# Patient Record
Sex: Male | Born: 1945 | Race: White | Hispanic: No | Marital: Married | Smoking: Former smoker
Health system: Southern US, Community
[De-identification: ages and names within clinical notes are randomized; demographics above are authoritative.]

## PROBLEM LIST (undated history)

## (undated) DIAGNOSIS — M3500 Sicca syndrome, unspecified: Secondary | ICD-10-CM

## (undated) DIAGNOSIS — R0600 Dyspnea, unspecified: Secondary | ICD-10-CM

## (undated) DIAGNOSIS — I10 Essential (primary) hypertension: Secondary | ICD-10-CM

## (undated) DIAGNOSIS — M329 Systemic lupus erythematosus, unspecified: Secondary | ICD-10-CM

## (undated) DIAGNOSIS — E7800 Pure hypercholesterolemia, unspecified: Secondary | ICD-10-CM

## (undated) DIAGNOSIS — B1920 Unspecified viral hepatitis C without hepatic coma: Secondary | ICD-10-CM

## (undated) DIAGNOSIS — K759 Inflammatory liver disease, unspecified: Secondary | ICD-10-CM

## (undated) DIAGNOSIS — M1990 Unspecified osteoarthritis, unspecified site: Secondary | ICD-10-CM

## (undated) DIAGNOSIS — F0280 Dementia in other diseases classified elsewhere without behavioral disturbance: Secondary | ICD-10-CM

## (undated) DIAGNOSIS — D61818 Other pancytopenia: Secondary | ICD-10-CM

## (undated) DIAGNOSIS — A6000 Herpesviral infection of urogenital system, unspecified: Secondary | ICD-10-CM

## (undated) DIAGNOSIS — IMO0002 Reserved for concepts with insufficient information to code with codable children: Secondary | ICD-10-CM

## (undated) HISTORY — PX: HERNIA REPAIR: SHX51

## (undated) HISTORY — DX: Dementia in other diseases classified elsewhere, unspecified severity, without behavioral disturbance, psychotic disturbance, mood disturbance, and anxiety: F02.80

## (undated) HISTORY — PX: COLONOSCOPY: SHX174

## (undated) HISTORY — PX: KNEE ARTHROSCOPY: SUR90

---

## 2004-01-16 ENCOUNTER — Other Ambulatory Visit: Payer: Self-pay

## 2004-04-06 ENCOUNTER — Ambulatory Visit: Payer: Self-pay | Admitting: Internal Medicine

## 2004-05-07 ENCOUNTER — Ambulatory Visit: Payer: Self-pay | Admitting: Internal Medicine

## 2004-07-10 ENCOUNTER — Observation Stay: Payer: Self-pay | Admitting: Specialist

## 2004-07-13 ENCOUNTER — Emergency Department: Payer: Self-pay | Admitting: Emergency Medicine

## 2004-07-26 ENCOUNTER — Inpatient Hospital Stay: Payer: Self-pay | Admitting: Specialist

## 2004-07-30 ENCOUNTER — Observation Stay: Payer: Self-pay | Admitting: Internal Medicine

## 2004-07-31 ENCOUNTER — Observation Stay: Payer: Self-pay | Admitting: Internal Medicine

## 2004-08-01 ENCOUNTER — Ambulatory Visit: Payer: Self-pay | Admitting: Internal Medicine

## 2004-08-03 ENCOUNTER — Observation Stay: Payer: Self-pay | Admitting: Internal Medicine

## 2004-08-07 ENCOUNTER — Ambulatory Visit: Payer: Self-pay | Admitting: Internal Medicine

## 2004-09-04 ENCOUNTER — Ambulatory Visit: Payer: Self-pay | Admitting: Internal Medicine

## 2004-10-05 ENCOUNTER — Ambulatory Visit: Payer: Self-pay | Admitting: Internal Medicine

## 2004-11-28 ENCOUNTER — Ambulatory Visit: Payer: Self-pay | Admitting: Internal Medicine

## 2004-12-05 ENCOUNTER — Ambulatory Visit: Payer: Self-pay | Admitting: Internal Medicine

## 2004-12-06 ENCOUNTER — Encounter: Payer: Self-pay | Admitting: Rheumatology

## 2005-01-03 ENCOUNTER — Ambulatory Visit: Payer: Self-pay | Admitting: Rheumatology

## 2005-01-04 ENCOUNTER — Encounter: Payer: Self-pay | Admitting: Rheumatology

## 2005-01-04 ENCOUNTER — Ambulatory Visit: Payer: Self-pay | Admitting: Internal Medicine

## 2008-08-15 ENCOUNTER — Ambulatory Visit: Payer: Self-pay | Admitting: Gastroenterology

## 2010-08-09 ENCOUNTER — Ambulatory Visit: Payer: Self-pay | Admitting: Family Medicine

## 2013-09-05 ENCOUNTER — Ambulatory Visit: Payer: Self-pay | Admitting: Gastroenterology

## 2013-09-07 LAB — PATHOLOGY REPORT

## 2013-09-09 ENCOUNTER — Ambulatory Visit: Payer: Self-pay | Admitting: Rheumatology

## 2013-09-12 ENCOUNTER — Ambulatory Visit: Payer: Self-pay | Admitting: Rheumatology

## 2013-09-23 DIAGNOSIS — Z86010 Personal history of colonic polyps: Secondary | ICD-10-CM | POA: Insufficient documentation

## 2013-09-23 DIAGNOSIS — B1920 Unspecified viral hepatitis C without hepatic coma: Secondary | ICD-10-CM | POA: Insufficient documentation

## 2013-09-23 DIAGNOSIS — K648 Other hemorrhoids: Secondary | ICD-10-CM | POA: Insufficient documentation

## 2013-09-23 DIAGNOSIS — D126 Benign neoplasm of colon, unspecified: Secondary | ICD-10-CM | POA: Insufficient documentation

## 2014-01-05 ENCOUNTER — Ambulatory Visit: Payer: Self-pay

## 2014-07-10 DIAGNOSIS — N5201 Erectile dysfunction due to arterial insufficiency: Secondary | ICD-10-CM | POA: Insufficient documentation

## 2014-07-10 DIAGNOSIS — I10 Essential (primary) hypertension: Secondary | ICD-10-CM | POA: Insufficient documentation

## 2014-11-07 ENCOUNTER — Other Ambulatory Visit: Payer: Self-pay | Admitting: Gastroenterology

## 2014-11-07 DIAGNOSIS — D696 Thrombocytopenia, unspecified: Secondary | ICD-10-CM

## 2014-11-07 DIAGNOSIS — Z8619 Personal history of other infectious and parasitic diseases: Secondary | ICD-10-CM

## 2014-11-10 ENCOUNTER — Other Ambulatory Visit: Payer: Self-pay | Admitting: Gastroenterology

## 2014-11-10 ENCOUNTER — Ambulatory Visit
Admission: RE | Admit: 2014-11-10 | Discharge: 2014-11-10 | Disposition: A | Discharge status: Home / Self Care | Payer: Medicare Other | Source: Ambulatory Visit | Attending: Gastroenterology | Admitting: Gastroenterology

## 2014-11-10 ENCOUNTER — Ambulatory Visit: Admission: RE | Admit: 2014-11-10 | Payer: Medicare Other | Source: Ambulatory Visit

## 2014-11-10 DIAGNOSIS — K8020 Calculus of gallbladder without cholecystitis without obstruction: Secondary | ICD-10-CM | POA: Insufficient documentation

## 2014-11-10 DIAGNOSIS — D696 Thrombocytopenia, unspecified: Secondary | ICD-10-CM

## 2014-11-10 DIAGNOSIS — N281 Cyst of kidney, acquired: Secondary | ICD-10-CM

## 2014-11-10 DIAGNOSIS — R935 Abnormal findings on diagnostic imaging of other abdominal regions, including retroperitoneum: Secondary | ICD-10-CM

## 2014-11-10 DIAGNOSIS — Z8619 Personal history of other infectious and parasitic diseases: Secondary | ICD-10-CM

## 2014-11-15 ENCOUNTER — Ambulatory Visit
Admission: RE | Admit: 2014-11-15 | Discharge: 2014-11-15 | Disposition: A | Discharge status: Home / Self Care | Payer: Medicare Other | Source: Ambulatory Visit | Attending: Gastroenterology | Admitting: Gastroenterology

## 2014-11-15 DIAGNOSIS — N281 Cyst of kidney, acquired: Secondary | ICD-10-CM

## 2014-11-15 DIAGNOSIS — R935 Abnormal findings on diagnostic imaging of other abdominal regions, including retroperitoneum: Secondary | ICD-10-CM

## 2014-12-26 DIAGNOSIS — N281 Cyst of kidney, acquired: Secondary | ICD-10-CM | POA: Insufficient documentation

## 2015-02-06 DIAGNOSIS — F5104 Psychophysiologic insomnia: Secondary | ICD-10-CM | POA: Insufficient documentation

## 2015-02-06 DIAGNOSIS — D692 Other nonthrombocytopenic purpura: Secondary | ICD-10-CM | POA: Insufficient documentation

## 2016-08-14 DIAGNOSIS — E782 Mixed hyperlipidemia: Secondary | ICD-10-CM | POA: Insufficient documentation

## 2016-08-14 DIAGNOSIS — R55 Syncope and collapse: Secondary | ICD-10-CM | POA: Insufficient documentation

## 2017-02-12 DIAGNOSIS — K4090 Unilateral inguinal hernia, without obstruction or gangrene, not specified as recurrent: Secondary | ICD-10-CM | POA: Insufficient documentation

## 2017-06-18 ENCOUNTER — Other Ambulatory Visit: Payer: Self-pay | Admitting: Internal Medicine

## 2017-06-18 ENCOUNTER — Ambulatory Visit
Admission: RE | Admit: 2017-06-18 | Discharge: 2017-06-18 | Disposition: A | Discharge status: Home / Self Care | Payer: Medicare Other | Source: Ambulatory Visit | Attending: Internal Medicine | Admitting: Internal Medicine

## 2017-06-18 DIAGNOSIS — R29898 Other symptoms and signs involving the musculoskeletal system: Secondary | ICD-10-CM

## 2017-06-18 DIAGNOSIS — I739 Peripheral vascular disease, unspecified: Secondary | ICD-10-CM | POA: Insufficient documentation

## 2017-07-13 ENCOUNTER — Encounter: Payer: Self-pay | Admitting: *Deleted

## 2017-07-14 ENCOUNTER — Ambulatory Visit: Payer: Medicare HMO | Admitting: Anesthesiology

## 2017-07-14 ENCOUNTER — Encounter
Admission: RE | Disposition: A | Discharge status: Home / Self Care | Payer: Self-pay | Source: Ambulatory Visit | Attending: Gastroenterology

## 2017-07-14 ENCOUNTER — Ambulatory Visit
Admission: RE | Admit: 2017-07-14 | Discharge: 2017-07-14 | Disposition: A | Discharge status: Home / Self Care | Payer: Medicare HMO | Source: Ambulatory Visit | Attending: Gastroenterology | Admitting: Gastroenterology

## 2017-07-14 DIAGNOSIS — M329 Systemic lupus erythematosus, unspecified: Secondary | ICD-10-CM | POA: Diagnosis not present

## 2017-07-14 DIAGNOSIS — I10 Essential (primary) hypertension: Secondary | ICD-10-CM | POA: Insufficient documentation

## 2017-07-14 DIAGNOSIS — Z880 Allergy status to penicillin: Secondary | ICD-10-CM | POA: Diagnosis not present

## 2017-07-14 DIAGNOSIS — K621 Rectal polyp: Secondary | ICD-10-CM | POA: Diagnosis not present

## 2017-07-14 DIAGNOSIS — E7800 Pure hypercholesterolemia, unspecified: Secondary | ICD-10-CM | POA: Diagnosis not present

## 2017-07-14 DIAGNOSIS — K5730 Diverticulosis of large intestine without perforation or abscess without bleeding: Secondary | ICD-10-CM | POA: Insufficient documentation

## 2017-07-14 DIAGNOSIS — D124 Benign neoplasm of descending colon: Secondary | ICD-10-CM | POA: Insufficient documentation

## 2017-07-14 DIAGNOSIS — Z86010 Personal history of colonic polyps: Secondary | ICD-10-CM | POA: Insufficient documentation

## 2017-07-14 DIAGNOSIS — Z79899 Other long term (current) drug therapy: Secondary | ICD-10-CM | POA: Diagnosis not present

## 2017-07-14 DIAGNOSIS — B182 Chronic viral hepatitis C: Secondary | ICD-10-CM | POA: Diagnosis not present

## 2017-07-14 HISTORY — DX: Other pancytopenia: D61.818

## 2017-07-14 HISTORY — PX: COLONOSCOPY WITH PROPOFOL: SHX5780

## 2017-07-14 HISTORY — DX: Inflammatory liver disease, unspecified: K75.9

## 2017-07-14 HISTORY — DX: Unspecified viral hepatitis C without hepatic coma: B19.20

## 2017-07-14 HISTORY — DX: Essential (primary) hypertension: I10

## 2017-07-14 HISTORY — DX: Unspecified osteoarthritis, unspecified site: M19.90

## 2017-07-14 HISTORY — DX: Sjogren syndrome, unspecified: M35.00

## 2017-07-14 HISTORY — DX: Systemic lupus erythematosus, unspecified: M32.9

## 2017-07-14 HISTORY — DX: Pure hypercholesterolemia, unspecified: E78.00

## 2017-07-14 HISTORY — DX: Reserved for concepts with insufficient information to code with codable children: IMO0002

## 2017-07-14 HISTORY — DX: Herpesviral infection of urogenital system, unspecified: A60.00

## 2017-07-14 SURGERY — COLONOSCOPY WITH PROPOFOL
Anesthesia: General

## 2017-07-14 MED ORDER — MIDAZOLAM HCL 2 MG/2ML IJ SOLN
INTRAMUSCULAR | Status: AC
  Filled 2017-07-14: qty 2

## 2017-07-14 MED ORDER — EPHEDRINE SULFATE 50 MG/ML IJ SOLN
INTRAMUSCULAR | Status: DC | PRN
  Administered 2017-07-14: 10 mg via INTRAVENOUS
  Administered 2017-07-14: 5 mg via INTRAVENOUS

## 2017-07-14 MED ORDER — PHENYLEPHRINE HCL 10 MG/ML IJ SOLN
INTRAMUSCULAR | Status: AC
  Filled 2017-07-14: qty 1

## 2017-07-14 MED ORDER — SODIUM CHLORIDE 0.9 % IV SOLN: INTRAVENOUS | Status: DC

## 2017-07-14 MED ORDER — EPHEDRINE SULFATE 50 MG/ML IJ SOLN
INTRAMUSCULAR | Status: AC
  Filled 2017-07-14: qty 1

## 2017-07-14 MED ORDER — PROPOFOL 500 MG/50ML IV EMUL
INTRAVENOUS | Status: AC
  Filled 2017-07-14: qty 50

## 2017-07-14 MED ORDER — SODIUM CHLORIDE 0.9 % IV SOLN
INTRAVENOUS | Status: DC
  Administered 2017-07-14: 1000 mL via INTRAVENOUS

## 2017-07-14 MED ORDER — MIDAZOLAM HCL 2 MG/2ML IJ SOLN
INTRAMUSCULAR | Status: DC | PRN
  Administered 2017-07-14 (×2): 1 mg via INTRAVENOUS

## 2017-07-14 MED ORDER — FENTANYL CITRATE (PF) 100 MCG/2ML IJ SOLN
INTRAMUSCULAR | Status: AC
  Filled 2017-07-14: qty 2

## 2017-07-14 MED ORDER — FENTANYL CITRATE (PF) 100 MCG/2ML IJ SOLN
INTRAMUSCULAR | Status: DC | PRN
  Administered 2017-07-14: 50 ug via INTRAVENOUS
  Administered 2017-07-14 (×2): 25 ug via INTRAVENOUS

## 2017-07-14 MED ORDER — PHENYLEPHRINE HCL 10 MG/ML IJ SOLN
INTRAMUSCULAR | Status: DC | PRN
  Administered 2017-07-14 (×2): 50 ug via INTRAVENOUS

## 2017-07-14 MED ORDER — PROPOFOL 500 MG/50ML IV EMUL
INTRAVENOUS | Status: DC | PRN
  Administered 2017-07-14: 100 ug/kg/min via INTRAVENOUS

## 2017-07-14 NOTE — Anesthesia Preprocedure Evaluation (Signed)
Anesthesia Evaluation  Patient identified by MRN, date of birth, ID band Patient awake    Reviewed: Allergy & Precautions, NPO status , Patient's Chart, lab work & pertinent test results  History of Anesthesia Complications Negative for: history of anesthetic complications  Airway Mallampati: III       Dental   Pulmonary neg sleep apnea, neg COPD, Current Smoker,           Cardiovascular hypertension, Pt. on medications (-) Past MI and (-) CHF (-) dysrhythmias (-) Valvular Problems/Murmurs     Neuro/Psych    GI/Hepatic neg GERD  ,(+) Hepatitis - (treated), C  Endo/Other  neg diabetes  Renal/GU negative Renal ROS     Musculoskeletal   Abdominal   Peds  Hematology   Anesthesia Other Findings   Reproductive/Obstetrics                             Anesthesia Physical Anesthesia Plan  ASA: II  Anesthesia Plan: General   Post-op Pain Management:    Induction: Intravenous  PONV Risk Score and Plan: 1 and TIVA and Propofol infusion  Airway Management Planned: Nasal Cannula  Additional Equipment:   Intra-op Plan:   Post-operative Plan:   Informed Consent: I have reviewed the patients History and Physical, chart, labs and discussed the procedure including the risks, benefits and alternatives for the proposed anesthesia with the patient or authorized representative who has indicated his/her understanding and acceptance.     Plan Discussed with:   Anesthesia Plan Comments:         Anesthesia Quick Evaluation

## 2017-07-14 NOTE — Anesthesia Postprocedure Evaluation (Signed)
Anesthesia Post Note  Patient: Juan Nguyen  Procedure(s) Performed: COLONOSCOPY WITH PROPOFOL (N/A )  Patient location during evaluation: Endoscopy Anesthesia Type: General Level of consciousness: awake and alert Pain management: pain level controlled Vital Signs Assessment: post-procedure vital signs reviewed and stable Respiratory status: spontaneous breathing and respiratory function stable Cardiovascular status: stable Anesthetic complications: no     Last Vitals:  Vitals:   07/14/17 1150 07/14/17 1200  BP: 102/60 109/68  Pulse: 83 83  Resp: 17 15  Temp:    SpO2: 100% 97%    Last Pain:  Vitals:   07/14/17 1150  TempSrc:   PainSc: Asleep                 KEPHART,WILLIAM K

## 2017-07-14 NOTE — H&P (Signed)
Outpatient short stay form Pre-procedure 07/14/2017 10:27 AM Juan Sails MD  Primary Physician: Dr. Frazier Richards  Reason for visit:  Colonoscopy  History of present illness:  Patient is a 72 year old male presenting today as above. Is a personal history of thrombocytopenia and his platelet count and INR were checked yesterday. His platelet count was 86 is actually higher than is been over the course the last year. His INR was 1.1. He does have a history of chronic hepatitis C that is status post treatment and eradication. He tolerated his prep well. He takes no aspirin or blood thinning agents.    Current Facility-Administered Medications:  .  0.9 %  sodium chloride infusion, , Intravenous, Continuous, Juan Sails, MD, Last Rate: 20 mL/hr at 07/14/17 1019, 1,000 mL at 07/14/17 1019 .  0.9 %  sodium chloride infusion, , Intravenous, Continuous, Juan Sails, MD  Medications Prior to Admission  Medication Sig Dispense Refill Last Dose  . amLODipine (NORVASC) 2.5 MG tablet Take 2.5 mg by mouth daily.   07/14/2017 at Unknown time  . diazepam (VALIUM) 5 MG tablet Take 5 mg by mouth 2 (two) times daily.   07/13/2017 at Unknown time  . lisinopril (PRINIVIL,ZESTRIL) 20 MG tablet Take 20 mg by mouth daily.   07/14/2017 at Unknown time  . polyethylene glycol powder (GLYCOLAX/MIRALAX) powder Take 1 Container by mouth once.   07/13/2017 at Unknown time  . pravastatin (PRAVACHOL) 20 MG tablet Take 20 mg by mouth daily.   07/12/2017  . sildenafil (VIAGRA) 100 MG tablet Take 100 mg by mouth daily as needed for erectile dysfunction.     . triamcinolone cream (KENALOG) 0.1 % Apply 1 application topically 2 (two) times daily.   Not Taking at Unknown time     Allergies  Allergen Reactions  . Eggs Or Egg-Derived Products Nausea Only  . Penicillins Other (See Comments)  . Shellfish-Derived Products Hives     Past Medical History:  Diagnosis Date  . Arthritis    RHEUMATOID  . Genital  herpes   . Hepatitis    HEP.C  . Hepatitis C   . Hypercholesteremia   . Hypertension   . Lupus   . Pancytopenia ([place])   . Sjogren's syndrome ([place])     Review of systems:      Physical Exam    Heart and lungs: Regular rate and rhythm without rub or gallop, lungs are bilaterally clear.    HEENT: Normocephalic atraumatic eyes are anicteric    Other:     Pertinant exam for procedure: Soft nontender nondistended bowel sounds positive normoactive    Planned proceedures: Colonoscopy and indicated procedures. I have discussed the risks benefits and complications of procedures to include not limited to bleeding, infection, perforation and the risk of sedation and the patient wishes to proceed.    Juan Sails, MD Gastroenterology 07/14/2017  10:27 AM

## 2017-07-14 NOTE — Transfer of Care (Signed)
Immediate Anesthesia Transfer of Care Note  Patient: Juan Nguyen  Procedure(s) Performed: COLONOSCOPY WITH PROPOFOL (N/A )  Patient Location: PACU  Anesthesia Type:General  Level of Consciousness: awake and sedated  Airway & Oxygen Therapy: Patient Spontanous Breathing and Patient connected to nasal cannula oxygen  Post-op Assessment: Report given to RN and Post -op Vital signs reviewed and stable  Post vital signs: Reviewed and stable  Last Vitals:  Vitals:   07/14/17 1012  BP: 122/81  Pulse: 86  Resp: 16  Temp: 36.7 C  SpO2: 96%    Last Pain:  Vitals:   07/14/17 1012  TempSrc: Tympanic  PainSc: 4          Complications: No apparent anesthesia complications

## 2017-07-14 NOTE — Anesthesia Post-op Follow-up Note (Signed)
Anesthesia QCDR form completed.        

## 2017-07-14 NOTE — Anesthesia Procedure Notes (Signed)
Performed by: Cook-Martin, Mattisen Pohlmann Pre-anesthesia Checklist: Patient identified, Emergency Drugs available, Suction available, Patient being monitored and Timeout performed Patient Re-evaluated:Patient Re-evaluated prior to induction Oxygen Delivery Method: Nasal cannula Preoxygenation: Pre-oxygenation with 100% oxygen Induction Type: IV induction Placement Confirmation: positive ETCO2 and CO2 detector       

## 2017-07-14 NOTE — Op Note (Signed)
[facility] Gastroenterology Patient Name: Juan Nguyen Procedure Date: 07/14/2017 10:28 AM MRN: [id number] Account #: [id number] Date of Birth: January 12, 1946 Admit Type: Outpatient Age: 72 Room: [facility] ENDO ROOM 3 Gender: Male Note Status: Finalized Procedure:            Colonoscopy Indications:          Personal history of colonic polyps Providers:            Lollie Sails, MD Referring MD:         Ocie Cornfield. Ouida Sills MD, MD (Referring MD) Medicines:            Monitored Anesthesia Care Complications:        No immediate complications. Procedure:            Pre-Anesthesia Assessment:                       - ASA Grade Assessment: III - A patient with severe                        systemic disease.                       After obtaining informed consent, the colonoscope was                        passed under direct vision. Throughout the procedure,                        the patient's blood pressure, pulse, and oxygen                        saturations were monitored continuously. The                        Colonoscope was introduced through the anus and                        advanced to the the cecum, identified by appendiceal                        orifice and ileocecal valve. The colonoscopy was                        unusually difficult due to poor bowel prep, significant                        looping and a tortuous colon. Successful completion of                        the procedure was aided by changing the patient to a                        supine position, changing the patient to a prone                        position, using manual pressure and lavage. The quality                        of the bowel preparation was fair. Findings:      A 4 mm polyp  was found in the descending colon. The polyp was sessile.       The polyp was removed with a cold snare. Resection and retrieval were       complete.      A 1 mm polyp was found in the rectum. The polyp  was sessile. The polyp       was removed with a cold biopsy forceps. Resection and retrieval were       complete.      Many medium-mouthed diverticula were found in the sigmoid colon and       descending colon.      The digital rectal exam was normal. Impression:           - Preparation of the colon was fair.                       - One 4 mm polyp in the descending colon, removed with                        a cold snare. Resected and retrieved.                       - One 1 mm polyp in the rectum, removed with a cold                        biopsy forceps. Resected and retrieved.                       - Diverticulosis in the sigmoid colon and in the                        descending colon. Recommendation:       - Soft diet for 2 days.                       - Await pathology results.                       - Telephone GI clinic for pathology results in 1 week. Procedure Code(s):    --- Professional ---                       [phone], Colonoscopy, flexible; with removal of tumor(s),                        polyp(s), or other lesion(s) by snare technique                       [id number], 10, Colonoscopy, flexible; with biopsy, single                        or multiple Diagnosis Code(s):    --- Professional ---                       D12.4, Benign neoplasm of descending colon                       K62.1, Rectal polyp                       Z86.010, Personal history of colonic polyps  K57.30, Diverticulosis of large intestine without                        perforation or abscess without bleeding CPT copyright 2016 American Medical Association. All rights reserved. The codes documented in this report are preliminary and upon coder review may  be revised to meet current compliance requirements. Lollie Sails, MD 07/14/2017 11:29:53 AM This report has been signed electronically. Number of Addenda: 0 Note Initiated On: 07/14/2017 10:28 AM Scope Withdrawal Time: 0 hours 13 minutes 46  seconds  Total Procedure Duration: 0 hours 50 minutes 39 seconds       [facility]

## 2017-07-15 ENCOUNTER — Encounter: Payer: Self-pay | Admitting: Gastroenterology

## 2017-07-15 LAB — SURGICAL PATHOLOGY

## 2017-07-23 ENCOUNTER — Other Ambulatory Visit: Payer: Self-pay | Admitting: Gastroenterology

## 2017-07-23 DIAGNOSIS — Z8619 Personal history of other infectious and parasitic diseases: Secondary | ICD-10-CM

## 2017-07-23 DIAGNOSIS — D696 Thrombocytopenia, unspecified: Secondary | ICD-10-CM

## 2017-07-29 ENCOUNTER — Ambulatory Visit
Admission: RE | Admit: 2017-07-29 | Discharge: 2017-07-29 | Disposition: A | Discharge status: Home / Self Care | Payer: Medicare HMO | Source: Ambulatory Visit | Attending: Gastroenterology | Admitting: Gastroenterology

## 2017-07-29 DIAGNOSIS — N281 Cyst of kidney, acquired: Secondary | ICD-10-CM | POA: Insufficient documentation

## 2017-07-29 DIAGNOSIS — D696 Thrombocytopenia, unspecified: Secondary | ICD-10-CM | POA: Diagnosis present

## 2017-07-29 DIAGNOSIS — K8020 Calculus of gallbladder without cholecystitis without obstruction: Secondary | ICD-10-CM | POA: Diagnosis not present

## 2017-07-29 DIAGNOSIS — Q8909 Congenital malformations of spleen: Secondary | ICD-10-CM | POA: Diagnosis not present

## 2017-07-29 DIAGNOSIS — Z8619 Personal history of other infectious and parasitic diseases: Secondary | ICD-10-CM

## 2017-07-30 ENCOUNTER — Encounter
Admission: RE | Admit: 2017-07-30 | Discharge: 2017-07-30 | Disposition: A | Discharge status: Home / Self Care | Payer: Medicare HMO | Source: Ambulatory Visit | Attending: Surgery | Admitting: Surgery

## 2017-07-30 ENCOUNTER — Other Ambulatory Visit: Payer: Self-pay

## 2017-07-30 DIAGNOSIS — I10 Essential (primary) hypertension: Secondary | ICD-10-CM | POA: Diagnosis present

## 2017-07-30 DIAGNOSIS — Z01810 Encounter for preprocedural cardiovascular examination: Secondary | ICD-10-CM | POA: Diagnosis present

## 2017-07-30 HISTORY — DX: Dyspnea, unspecified: R06.00

## 2017-07-30 NOTE — Patient Instructions (Signed)
Your procedure is scheduled on: Tuesday 08/04/17 Report to [facility]. 2ND FLOOR MEDICAL MALL ENTRANCE. To find out your arrival time please call [phone] between 1PM - 3PM on Monday 08/03/17.  Remember: Instructions that are not followed completely may result in serious medical risk, up to and including death, or upon the discretion of your surgeon and anesthesiologist your surgery may need to be rescheduled.    __X__ 1. Do not eat anything after midnight the night before your    procedure.  No gum chewing or hard candies.  You may drink clear   liquids up to 2 hours before you are scheduled to arrive at the   hospital for your procedure. Do not drink clear liquids within 2   hours of scheduled arrival to the hospital as this may lead to your   procedure being delayed or rescheduled.       Clear liquids include:   Water or Apple juice without pulp   Clear carbohydrate beverage such as Clearfast or Gatorade   Black coffee or Clear Tea (no milk, no creamer, do not add anything   to the coffee or tea)    Diabetics should only drink water   __X__ 2. No Alcohol for 24 hours before or after surgery.   ____ 3. Bring all medications with you on the day of surgery if instructed.    __X__ 4. Notify your doctor if there is any change in your medical condition     (cold, fever, infections).             __X___5. No smoking within 24 hours of your surgery.     Do not wear jewelry, make-up, hairpins, clips or nail polish.  Do not wear lotions, powders, or perfumes.   Do not shave 48 hours prior to surgery. Men may shave face and neck.  Do not bring valuables to the hospital.    [facility] is not responsible for any belongings or valuables.               Contacts, dentures or bridgework may not be worn into surgery.  Leave your suitcase in the car. After surgery it may be brought to your room.  For patients admitted to the hospital, discharge time is determined by your                treatment  team.   Patients discharged the day of surgery will not be allowed to drive home.   Please read over the following fact sheets that you were given:   MRSA Information   __X__ Take these medicines the morning of surgery with A SIP OF WATER:    1. AMLODIPINE  2. DIAZEPAM  3. TRAMADOL IF NEEDED  4.  5.  6.  ____ Fleet Enema (as directed)   __X__ Use CHG Soap/SAGE wipes as directed  ____ Use inhalers on the day of surgery  ____ Stop metformin 2 days prior to surgery    ____ Take 1/2 of usual insulin dose the night before surgery and none on the morning of surgery.   ____ Stop Coumadin/Plavix/aspirin on   __X__ Stop Anti-inflammatories such as Advil, Aleve, Ibuprofen, Motrin, Naproxen, Naprosyn, Goodies,powder, or aspirin products.  OK to take Tylenol.   __X__ Stop supplements, Vitamin E, Fish Oil until after surgery.    ____ Bring C-Pap to the hospital.

## 2017-08-04 ENCOUNTER — Ambulatory Visit: Payer: Medicare HMO | Admitting: Anesthesiology

## 2017-08-04 ENCOUNTER — Encounter: Payer: Self-pay | Admitting: Anesthesiology

## 2017-08-04 ENCOUNTER — Ambulatory Visit: Payer: Medicare HMO

## 2017-08-04 ENCOUNTER — Ambulatory Visit
Admission: RE | Admit: 2017-08-04 | Discharge: 2017-08-04 | Disposition: A | Discharge status: Home / Self Care | Payer: Medicare HMO | Source: Ambulatory Visit | Attending: Surgery | Admitting: Surgery

## 2017-08-04 ENCOUNTER — Encounter
Admission: RE | Disposition: A | Discharge status: Home / Self Care | Payer: Self-pay | Source: Ambulatory Visit | Attending: Surgery

## 2017-08-04 DIAGNOSIS — F17200 Nicotine dependence, unspecified, uncomplicated: Secondary | ICD-10-CM | POA: Insufficient documentation

## 2017-08-04 DIAGNOSIS — D61818 Other pancytopenia: Secondary | ICD-10-CM | POA: Insufficient documentation

## 2017-08-04 DIAGNOSIS — M329 Systemic lupus erythematosus, unspecified: Secondary | ICD-10-CM | POA: Diagnosis not present

## 2017-08-04 DIAGNOSIS — K4090 Unilateral inguinal hernia, without obstruction or gangrene, not specified as recurrent: Secondary | ICD-10-CM | POA: Diagnosis present

## 2017-08-04 DIAGNOSIS — M069 Rheumatoid arthritis, unspecified: Secondary | ICD-10-CM | POA: Diagnosis not present

## 2017-08-04 DIAGNOSIS — M3500 Sicca syndrome, unspecified: Secondary | ICD-10-CM | POA: Insufficient documentation

## 2017-08-04 DIAGNOSIS — B1920 Unspecified viral hepatitis C without hepatic coma: Secondary | ICD-10-CM | POA: Insufficient documentation

## 2017-08-04 DIAGNOSIS — I10 Essential (primary) hypertension: Secondary | ICD-10-CM | POA: Diagnosis not present

## 2017-08-04 DIAGNOSIS — Z79899 Other long term (current) drug therapy: Secondary | ICD-10-CM | POA: Diagnosis not present

## 2017-08-04 DIAGNOSIS — E7800 Pure hypercholesterolemia, unspecified: Secondary | ICD-10-CM | POA: Insufficient documentation

## 2017-08-04 HISTORY — PX: INGUINAL HERNIA REPAIR: SHX194

## 2017-08-04 LAB — PLATELET COUNT: PLATELETS: 106 10*3/uL — AB (ref 150–440)

## 2017-08-04 SURGERY — REPAIR, HERNIA, INGUINAL, ADULT
Anesthesia: General | Site: Groin | Laterality: Right | Wound class: Clean

## 2017-08-04 MED ORDER — BUPIVACAINE-EPINEPHRINE (PF) 0.5% -1:200000 IJ SOLN
INTRAMUSCULAR | Status: DC | PRN
  Administered 2017-08-04: 13 mL

## 2017-08-04 MED ORDER — FAMOTIDINE 20 MG PO TABS
ORAL_TABLET | ORAL | Status: AC
  Filled 2017-08-04: qty 1

## 2017-08-04 MED ORDER — VANCOMYCIN HCL 1000 MG IV SOLR
INTRAVENOUS | Status: AC
  Filled 2017-08-04: qty 1000

## 2017-08-04 MED ORDER — PROPOFOL 10 MG/ML IV BOLUS
INTRAVENOUS | Status: DC | PRN
  Administered 2017-08-04: 100 mg via INTRAVENOUS

## 2017-08-04 MED ORDER — BUPIVACAINE-EPINEPHRINE (PF) 0.5% -1:200000 IJ SOLN
INTRAMUSCULAR | Status: AC
  Filled 2017-08-04: qty 30

## 2017-08-04 MED ORDER — VANCOMYCIN HCL 1000 MG IV SOLR
INTRAVENOUS | Status: DC | PRN
  Administered 2017-08-04: 1000 mg via INTRAVENOUS

## 2017-08-04 MED ORDER — LACTATED RINGERS IV SOLN
INTRAVENOUS | Status: DC
  Administered 2017-08-04: 11:00:00 via INTRAVENOUS

## 2017-08-04 MED ORDER — OXYCODONE HCL 5 MG/5ML PO SOLN: 5.0000 mg | Freq: Once | ORAL | Status: DC | PRN

## 2017-08-04 MED ORDER — MIDAZOLAM HCL 2 MG/2ML IJ SOLN
INTRAMUSCULAR | Status: AC
  Filled 2017-08-04: qty 2

## 2017-08-04 MED ORDER — ONDANSETRON HCL 4 MG/2ML IJ SOLN
INTRAMUSCULAR | Status: AC
  Filled 2017-08-04: qty 2

## 2017-08-04 MED ORDER — HYDROCODONE-ACETAMINOPHEN 5-325 MG PO TABS: 1.0000 | ORAL_TABLET | Freq: Four times a day (QID) | ORAL | 0 refills | Status: DC | PRN

## 2017-08-04 MED ORDER — FAMOTIDINE 20 MG PO TABS
20.0000 mg | ORAL_TABLET | Freq: Once | ORAL | Status: AC
  Administered 2017-08-04: 20 mg via ORAL

## 2017-08-04 MED ORDER — FENTANYL CITRATE (PF) 100 MCG/2ML IJ SOLN: 25.0000 ug | INTRAMUSCULAR | Status: DC | PRN

## 2017-08-04 MED ORDER — SUCCINYLCHOLINE CHLORIDE 20 MG/ML IJ SOLN
INTRAMUSCULAR | Status: AC
  Filled 2017-08-04: qty 1

## 2017-08-04 MED ORDER — LIDOCAINE HCL (CARDIAC) 20 MG/ML IV SOLN
INTRAVENOUS | Status: DC | PRN
  Administered 2017-08-04: 100 mg via INTRAVENOUS

## 2017-08-04 MED ORDER — PHENYLEPHRINE HCL 10 MG/ML IJ SOLN
INTRAMUSCULAR | Status: DC | PRN
  Administered 2017-08-04: 100 ug via INTRAVENOUS
  Administered 2017-08-04: 300 ug via INTRAVENOUS
  Administered 2017-08-04: 200 ug via INTRAVENOUS

## 2017-08-04 MED ORDER — FENTANYL CITRATE (PF) 100 MCG/2ML IJ SOLN
INTRAMUSCULAR | Status: DC | PRN
  Administered 2017-08-04: 50 ug via INTRAVENOUS

## 2017-08-04 MED ORDER — SUCCINYLCHOLINE CHLORIDE 20 MG/ML IJ SOLN
INTRAMUSCULAR | Status: DC | PRN
  Administered 2017-08-04: 100 mg via INTRAVENOUS

## 2017-08-04 MED ORDER — DEXAMETHASONE SODIUM PHOSPHATE 10 MG/ML IJ SOLN
INTRAMUSCULAR | Status: AC
  Filled 2017-08-04: qty 1

## 2017-08-04 MED ORDER — LIDOCAINE HCL (PF) 2 % IJ SOLN
INTRAMUSCULAR | Status: AC
  Filled 2017-08-04: qty 10

## 2017-08-04 MED ORDER — FENTANYL CITRATE (PF) 250 MCG/5ML IJ SOLN
INTRAMUSCULAR | Status: AC
  Filled 2017-08-04: qty 5

## 2017-08-04 MED ORDER — ONDANSETRON HCL 4 MG/2ML IJ SOLN
INTRAMUSCULAR | Status: DC | PRN
  Administered 2017-08-04: 4 mg via INTRAVENOUS

## 2017-08-04 MED ORDER — OXYCODONE HCL 5 MG PO TABS: 5.0000 mg | ORAL_TABLET | Freq: Once | ORAL | Status: DC | PRN

## 2017-08-04 MED ORDER — LIDOCAINE HCL 4 % MT SOLN
OROMUCOSAL | Status: DC | PRN
  Administered 2017-08-04: 4 mL via TOPICAL

## 2017-08-04 MED ORDER — PROPOFOL 10 MG/ML IV BOLUS
INTRAVENOUS | Status: AC
  Filled 2017-08-04: qty 20

## 2017-08-04 MED ORDER — DEXAMETHASONE SODIUM PHOSPHATE 10 MG/ML IJ SOLN
INTRAMUSCULAR | Status: DC | PRN
  Administered 2017-08-04: 5 mg via INTRAVENOUS

## 2017-08-04 SURGICAL SUPPLY — 27 items
BLADE SURG 15 STRL LF DISP TIS (BLADE) ×1 IMPLANT
BLADE SURG 15 STRL SS (BLADE) ×2
CANISTER SUCT 1200ML W/VALVE (MISCELLANEOUS) ×3 IMPLANT
CHLORAPREP W/TINT 26ML (MISCELLANEOUS) ×3 IMPLANT
DERMABOND ADVANCED (GAUZE/BANDAGES/DRESSINGS) ×2
DERMABOND ADVANCED .7 DNX12 (GAUZE/BANDAGES/DRESSINGS) ×1 IMPLANT
DRAIN PENROSE 5/8X18 LTX STRL (WOUND CARE) ×3 IMPLANT
DRAPE LAPAROTOMY 77X122 PED (DRAPES) ×3 IMPLANT
DRAPE LAPAROTOMY TRNSV 106X77 (MISCELLANEOUS) IMPLANT
ELECT REM PT RETURN 9FT ADLT (ELECTROSURGICAL) ×3
ELECTRODE REM PT RTRN 9FT ADLT (ELECTROSURGICAL) ×1 IMPLANT
GLOVE BIO SURGEON STRL SZ7.5 (GLOVE) ×3 IMPLANT
GOWN STRL REUS W/ TWL LRG LVL3 (GOWN DISPOSABLE) ×3 IMPLANT
GOWN STRL REUS W/TWL LRG LVL3 (GOWN DISPOSABLE) ×6
KIT RM TURNOVER STRD PROC AR (KITS) ×3 IMPLANT
LABEL OR SOLS (LABEL) ×3 IMPLANT
MESH SYNTHETIC 4X6 SOFT BARD (Mesh General) ×1 IMPLANT
MESH SYNTHETIC SOFT BARD 4X6 (Mesh General) ×2 IMPLANT
NEEDLE HYPO 25X1 1.5 SAFETY (NEEDLE) ×3 IMPLANT
NS IRRIG 500ML POUR BTL (IV SOLUTION) ×3 IMPLANT
PACK BASIN MINOR ARMC (MISCELLANEOUS) ×3 IMPLANT
SUT CHROMIC 4 0 RB 1X27 (SUTURE) ×3 IMPLANT
SUT MNCRL AB 4-0 PS2 18 (SUTURE) ×3 IMPLANT
SUT SURGILON 0 30 BLK (SUTURE) ×9 IMPLANT
SUT VIC AB 4-0 SH 27 (SUTURE) ×4
SUT VIC AB 4-0 SH 27XANBCTRL (SUTURE) ×2 IMPLANT
SYR 10ML LL (SYRINGE) ×3 IMPLANT

## 2017-08-04 NOTE — Discharge Instructions (Addendum)
Take Tylenol or Norco if needed for incisional pain.  Should not take tramadol and Norco at the same time.  Should not drive when taking Norco or tramadol.  May shower and blot dry.  Avoid straining and heavy lifting.     AMBULATORY SURGERY  DISCHARGE INSTRUCTIONS   1) The drugs that you were given will stay in your system until tomorrow so for the next 24 hours you should not:  A) Drive an automobile B) Make any legal decisions C) Drink any alcoholic beverage   2) You may resume regular meals tomorrow.  Today it is better to start with liquids and gradually work up to solid foods.  You may eat anything you prefer, but it is better to start with liquids, then soup and crackers, and gradually work up to solid foods.   3) Please notify your doctor immediately if you have any unusual bleeding, trouble breathing, redness and pain at the surgery site, drainage, fever, or pain not relieved by medication.    4) Additional Instructions:        Please contact your physician with any problems or Same Day Surgery at [phone], Monday through Friday 6 am to 4 pm, or [place] [facility] number at [phone].

## 2017-08-04 NOTE — H&P (Signed)
  He comes in today prepared for right inguinal hernia repair.  He has also recently had pain in his right knee and has had orthopedic consultation and has taken tramadol which does not seem to help much.  He also reports some pain in his right shoulder.  The right side was marked YES.  Lab work reviewed.  Platelet count 106,000  I discussed the plan for right inguinal hernia repair and discussed the risk associated with surgery.

## 2017-08-04 NOTE — Anesthesia Preprocedure Evaluation (Addendum)
Anesthesia Evaluation  Patient identified by MRN, date of birth, ID band Patient awake and Patient confused    Reviewed: Allergy & Precautions, H&P , NPO status , Patient's Chart, lab work & pertinent test results  History of Anesthesia Complications Negative for: history of anesthetic complications  Airway Mallampati: III  TM Distance: >3 FB Neck ROM: limited    Dental  (+) Poor Dentition, Missing, Edentulous Upper, Edentulous Lower   Pulmonary shortness of breath and with exertion, COPD, Current Smoker,           Cardiovascular Exercise Tolerance: Good hypertension, (-) angina(-) Past MI      Neuro/Psych negative neurological ROS  negative psych ROS   GI/Hepatic negative GI ROS, (+) Hepatitis -, C  Endo/Other  negative endocrine ROS  Renal/GU      Musculoskeletal  (+) Arthritis ,   Abdominal   Peds  Hematology negative hematology ROS (+)   Anesthesia Other Findings Past Medical History: No date: Arthritis     Comment:  RHEUMATOID No date: Dyspnea No date: Genital herpes No date: Hepatitis     Comment:  HEP.C No date: Hepatitis C No date: Hypercholesteremia No date: Hypertension No date: Lupus No date: Pancytopenia ([place]) No date: Sjogren's syndrome [facility])  Past Surgical History: No date: COLONOSCOPY 07/14/2017: COLONOSCOPY WITH PROPOFOL; N/A     Comment:  Procedure: COLONOSCOPY WITH PROPOFOL;  Surgeon:               Lollie Sails, MD;  Location: [facility] ENDOSCOPY;                Service: Endoscopy;  Laterality: N/A; No date: HERNIA REPAIR; Left No date: KNEE ARTHROSCOPY     Reproductive/Obstetrics negative OB ROS                             Anesthesia Physical Anesthesia Plan  ASA: III  Anesthesia Plan: General ETT   Post-op Pain Management:    Induction: Intravenous  PONV Risk Score and Plan: 3 and Ondansetron, Dexamethasone and Treatment may vary due to age or  medical condition  Airway Management Planned: Oral ETT  Additional Equipment:   Intra-op Plan:   Post-operative Plan: Extubation in OR  Informed Consent: I have reviewed the patients History and Physical, chart, labs and discussed the procedure including the risks, benefits and alternatives for the proposed anesthesia with the patient or authorized representative who has indicated his/her understanding and acceptance.   Dental Advisory Given  Plan Discussed with: Anesthesiologist, CRNA and Surgeon  Anesthesia Plan Comments: (Patient seemed somewhat confused about the procedure, patient and family member were both consented   They were consented for risks of anesthesia including but not limited to:  - adverse reactions to medications - damage to teeth, lips or other oral mucosa - sore throat or hoarseness - Damage to heart, brain, lungs or loss of life.  They voiced understanding.)       Anesthesia Quick Evaluation

## 2017-08-04 NOTE — Op Note (Signed)
OPERATIVE REPORT  PREOPERATIVE DIAGNOSIS: Right inguinal hernia  POSTOPERATIVE DIAGNOSIS: Right inguinal hernia  PROCEDURE: Right inguinal hernia repair  ANESTHESIA:  General  SURGEON:  Rochel Brome M.D.  INDICATIONS: He has history of bulging in the right groin.  A right inguinal hernia was demonstrated on physical exam  With the patient on the operating table in the supine position the right lower quadrant was prepared with clippers and with ChloraPrep and draped in a sterile manner. A transversely oriented suprapubic incision was made and carried down through subcutaneous tissues. Electrocautery was used for hemostasis. The Scarpa's fascia was incised. The external oblique aponeurosis was incised along the course of its fibers to open the external ring and expose the inguinal cord structures. The cord structures were mobilized. A Penrose drain was passed around the cord structures for traction.  Cremaster fibers were separated to expose an indirect hernia sac.  The sac was dissected free from surrounding tissues up into the internal ring.  The sac was opened and its continuity with the peritoneal cavity was demonstrated.  A high ligation of the sac was done with a 0 Surgilon suture ligature.  The sac was excised and was not submitted for pathology.  The repair was carried out with 0 Surgilon suturing the conjoined tendon to the shelving edge of the inguinal ligament incorporating transversalis fascia into the repair stitch lead to satisfactory narrowing of the internal ring. Bard soft mesh was cut to create an oval shape and was placed over the repair. This was sutured to the repair with interrupted 0 Surgilon sutures and also sutured medially to the deep fascia and on both sides of the internal ring. Next after seeing hemostasis was intact the cord structures were replaced along the floor of the inguinal canal. The cut edges of the external oblique aponeurosis were closed with a running 4-0 Vicryl  suture to re-create the external ring. The deep fascia superior and lateral to the repair site was infiltrated with half percent Sensorcaine with epinephrine. Subcutaneous tissues were also infiltrated. The Scarpa's fascia was closed with interrupted 4-0 Vicryl sutures. The skin was closed with running 4-0 Monocryl subcuticular suture and LiquiBand. The testicle remained in the scrotum  The patient appeared to be in satisfactory condition and was prepared for transfer to the recovery room.  Rochel Brome M.D.

## 2017-08-04 NOTE — Transfer of Care (Signed)
Immediate Anesthesia Transfer of Care Note  Patient: Juan Nguyen  Procedure(s) Performed: HERNIA REPAIR INGUINAL ADULT (Right Groin)  Patient Location: PACU  Anesthesia Type:General  Level of Consciousness: alert   Airway & Oxygen Therapy: Patient Spontanous Breathing  Post-op Assessment: Report given to RN  Post vital signs: Reviewed and stable  Last Vitals:  Vitals:   08/04/17 1104 08/04/17 1420  BP: (!) 144/79 138/86  Pulse: 97 90  Resp: 16 15  Temp: 37.2 C 37.6 C  SpO2: 95% 100%    Last Pain:  Vitals:   08/04/17 1112  TempSrc:   PainSc: 8          Complications: No apparent anesthesia complications

## 2017-08-04 NOTE — Anesthesia Postprocedure Evaluation (Signed)
Anesthesia Post Note  Patient: Juan Nguyen  Procedure(s) Performed: HERNIA REPAIR INGUINAL ADULT (Right Groin)  Patient location during evaluation: PACU Anesthesia Type: General Level of consciousness: awake and alert Pain management: pain level controlled Vital Signs Assessment: post-procedure vital signs reviewed and stable Respiratory status: spontaneous breathing, nonlabored ventilation, respiratory function stable and patient connected to nasal cannula oxygen Cardiovascular status: blood pressure returned to baseline and stable Postop Assessment: no apparent nausea or vomiting Anesthetic complications: no     Last Vitals:  Vitals:   08/04/17 1450 08/04/17 1500  BP: (!) 149/91 (!) 152/82  Pulse: 91 84  Resp: 17 18  Temp: 37.6 C   SpO2: 98% 96%    Last Pain:  Vitals:   08/04/17 1112  TempSrc:   PainSc: 8                  Ahlayah Tarkowski K Shaddai Shapley

## 2017-08-04 NOTE — Anesthesia Post-op Follow-up Note (Signed)
Anesthesia QCDR form completed.        

## 2017-08-04 NOTE — Anesthesia Procedure Notes (Signed)
Procedure Name: Intubation Date/Time: 08/04/2017 12:59 PM Performed by: Clinton Sawyer, CRNA Pre-anesthesia Checklist: Patient identified, Emergency Drugs available, Suction available, Patient being monitored and Timeout performed Patient Re-evaluated:Patient Re-evaluated prior to induction Oxygen Delivery Method: Circle system utilized Preoxygenation: Pre-oxygenation with 100% oxygen Induction Type: IV induction Ventilation: Mask ventilation without difficulty and Oral airway inserted - appropriate to patient size Laryngoscope Size: Mac and 4 Grade View: Grade I Tube type: Oral Tube size: 7.0 mm Number of attempts: 1 Placement Confirmation: ETT inserted through vocal cords under direct vision,  positive ETCO2,  CO2 detector and breath sounds checked- equal and bilateral Secured at: 22 cm Tube secured with: Tape Dental Injury: Teeth and Oropharynx as per pre-operative assessment

## 2017-08-05 ENCOUNTER — Encounter: Payer: Self-pay | Admitting: Surgery

## 2017-08-18 DIAGNOSIS — I700 Atherosclerosis of aorta: Secondary | ICD-10-CM | POA: Insufficient documentation

## 2017-08-26 DIAGNOSIS — IMO0002 Reserved for concepts with insufficient information to code with codable children: Secondary | ICD-10-CM | POA: Insufficient documentation

## 2017-08-26 DIAGNOSIS — D696 Thrombocytopenia, unspecified: Secondary | ICD-10-CM | POA: Insufficient documentation

## 2017-08-26 DIAGNOSIS — M329 Systemic lupus erythematosus, unspecified: Secondary | ICD-10-CM | POA: Insufficient documentation

## 2017-08-27 ENCOUNTER — Ambulatory Visit: Admission: RE | Admit: 2017-08-27 | Payer: Medicare HMO | Source: Ambulatory Visit

## 2017-08-31 ENCOUNTER — Ambulatory Visit
Admission: RE | Admit: 2017-08-31 | Discharge: 2017-08-31 | Disposition: A | Discharge status: Home / Self Care | Payer: Medicare HMO | Source: Ambulatory Visit | Attending: Gastroenterology | Admitting: Gastroenterology

## 2017-08-31 DIAGNOSIS — Z8619 Personal history of other infectious and parasitic diseases: Secondary | ICD-10-CM | POA: Diagnosis not present

## 2017-08-31 DIAGNOSIS — D696 Thrombocytopenia, unspecified: Secondary | ICD-10-CM | POA: Insufficient documentation

## 2017-09-07 DIAGNOSIS — R2681 Unsteadiness on feet: Secondary | ICD-10-CM | POA: Insufficient documentation

## 2017-09-07 DIAGNOSIS — R296 Repeated falls: Secondary | ICD-10-CM | POA: Insufficient documentation

## 2017-09-07 DIAGNOSIS — R29898 Other symptoms and signs involving the musculoskeletal system: Secondary | ICD-10-CM | POA: Insufficient documentation

## 2017-09-09 ENCOUNTER — Other Ambulatory Visit: Payer: Self-pay | Admitting: Internal Medicine

## 2017-09-09 DIAGNOSIS — R29898 Other symptoms and signs involving the musculoskeletal system: Secondary | ICD-10-CM

## 2017-09-09 DIAGNOSIS — R2681 Unsteadiness on feet: Secondary | ICD-10-CM

## 2017-09-15 DIAGNOSIS — G5721 Lesion of femoral nerve, right lower limb: Secondary | ICD-10-CM | POA: Insufficient documentation

## 2017-09-15 DIAGNOSIS — G629 Polyneuropathy, unspecified: Secondary | ICD-10-CM | POA: Insufficient documentation

## 2017-09-18 ENCOUNTER — Ambulatory Visit
Admission: RE | Admit: 2017-09-18 | Discharge: 2017-09-18 | Disposition: A | Discharge status: Home / Self Care | Payer: Medicare HMO | Source: Ambulatory Visit | Attending: Internal Medicine | Admitting: Internal Medicine

## 2017-09-18 DIAGNOSIS — M48061 Spinal stenosis, lumbar region without neurogenic claudication: Secondary | ICD-10-CM | POA: Diagnosis not present

## 2017-09-18 DIAGNOSIS — M5136 Other intervertebral disc degeneration, lumbar region: Secondary | ICD-10-CM | POA: Insufficient documentation

## 2017-09-18 DIAGNOSIS — M5126 Other intervertebral disc displacement, lumbar region: Secondary | ICD-10-CM | POA: Insufficient documentation

## 2017-09-18 DIAGNOSIS — R29898 Other symptoms and signs involving the musculoskeletal system: Secondary | ICD-10-CM | POA: Insufficient documentation

## 2017-09-18 DIAGNOSIS — R2681 Unsteadiness on feet: Secondary | ICD-10-CM | POA: Insufficient documentation

## 2017-09-18 DIAGNOSIS — R296 Repeated falls: Secondary | ICD-10-CM | POA: Insufficient documentation

## 2017-09-28 DIAGNOSIS — R700 Elevated erythrocyte sedimentation rate: Secondary | ICD-10-CM | POA: Insufficient documentation

## 2017-09-28 DIAGNOSIS — M5136 Other intervertebral disc degeneration, lumbar region: Secondary | ICD-10-CM | POA: Insufficient documentation

## 2017-12-14 DIAGNOSIS — D61818 Other pancytopenia: Secondary | ICD-10-CM | POA: Insufficient documentation

## 2017-12-14 DIAGNOSIS — M1990 Unspecified osteoarthritis, unspecified site: Secondary | ICD-10-CM | POA: Insufficient documentation

## 2017-12-14 DIAGNOSIS — R768 Other specified abnormal immunological findings in serum: Secondary | ICD-10-CM | POA: Insufficient documentation

## 2017-12-14 DIAGNOSIS — I87303 Chronic venous hypertension (idiopathic) without complications of bilateral lower extremity: Secondary | ICD-10-CM | POA: Insufficient documentation

## 2017-12-30 DIAGNOSIS — Z720 Tobacco use: Secondary | ICD-10-CM | POA: Insufficient documentation

## 2018-01-13 ENCOUNTER — Inpatient Hospital Stay: Payer: Medicare HMO | Attending: Oncology | Admitting: Oncology

## 2018-01-13 ENCOUNTER — Encounter: Payer: Self-pay | Admitting: Oncology

## 2018-01-13 ENCOUNTER — Inpatient Hospital Stay: Payer: Medicare HMO

## 2018-01-13 ENCOUNTER — Encounter (INDEPENDENT_AMBULATORY_CARE_PROVIDER_SITE_OTHER): Payer: Self-pay

## 2018-01-13 ENCOUNTER — Other Ambulatory Visit: Payer: Self-pay

## 2018-01-13 VITALS — BP 110/69 | HR 97 | Temp 97.6°F | Resp 18 | Ht 66.0 in | Wt 126.0 lb

## 2018-01-13 DIAGNOSIS — D61818 Other pancytopenia: Secondary | ICD-10-CM

## 2018-01-13 DIAGNOSIS — Z79899 Other long term (current) drug therapy: Secondary | ICD-10-CM | POA: Insufficient documentation

## 2018-01-13 DIAGNOSIS — D509 Iron deficiency anemia, unspecified: Secondary | ICD-10-CM

## 2018-01-13 DIAGNOSIS — R238 Other skin changes: Secondary | ICD-10-CM | POA: Diagnosis not present

## 2018-01-13 DIAGNOSIS — IMO0002 Reserved for concepts with insufficient information to code with codable children: Secondary | ICD-10-CM

## 2018-01-13 DIAGNOSIS — M329 Systemic lupus erythematosus, unspecified: Secondary | ICD-10-CM | POA: Insufficient documentation

## 2018-01-13 DIAGNOSIS — D72820 Lymphocytosis (symptomatic): Secondary | ICD-10-CM | POA: Diagnosis not present

## 2018-01-13 DIAGNOSIS — D696 Thrombocytopenia, unspecified: Secondary | ICD-10-CM | POA: Diagnosis not present

## 2018-01-13 DIAGNOSIS — R233 Spontaneous ecchymoses: Secondary | ICD-10-CM

## 2018-01-13 LAB — CBC WITH DIFFERENTIAL/PLATELET
BASOS ABS: 0 10*3/uL (ref 0–0.1)
BASOS PCT: 1 %
EOS PCT: 0 %
Eosinophils Absolute: 0 10*3/uL (ref 0–0.7)
HEMATOCRIT: 29.3 % — AB (ref 40.0–52.0)
Hemoglobin: 9.4 g/dL — ABNORMAL LOW (ref 13.0–18.0)
LYMPHS ABS: 0.3 10*3/uL — AB (ref 1.0–3.6)
Lymphocytes Relative: 9 %
MCH: 20.7 pg — AB (ref 26.0–34.0)
MCHC: 31.9 g/dL — AB (ref 32.0–36.0)
MCV: 64.9 fL — AB (ref 80.0–100.0)
MONOS PCT: 6 %
Monocytes Absolute: 0.2 10*3/uL (ref 0.2–1.0)
NEUTROS ABS: 3.3 10*3/uL (ref 1.4–6.5)
Neutrophils Relative %: 84 %
Platelets: 127 10*3/uL — ABNORMAL LOW (ref 150–440)
RBC: 4.51 MIL/uL (ref 4.40–5.90)
RDW: 21.2 % — AB (ref 11.5–14.5)
WBC: 3.8 10*3/uL (ref 3.8–10.6)

## 2018-01-13 LAB — COMPREHENSIVE METABOLIC PANEL
ALBUMIN: 4.1 g/dL (ref 3.5–5.0)
ALT: 8 U/L (ref 0–44)
AST: 18 U/L (ref 15–41)
Alkaline Phosphatase: 50 U/L (ref 38–126)
Anion gap: 9 (ref 5–15)
BILIRUBIN TOTAL: 1.2 mg/dL (ref 0.3–1.2)
BUN: 25 mg/dL — AB (ref 8–23)
CO2: 28 mmol/L (ref 22–32)
Calcium: 8.8 mg/dL — ABNORMAL LOW (ref 8.9–10.3)
Chloride: 102 mmol/L (ref 98–111)
Creatinine, Ser: 0.73 mg/dL (ref 0.61–1.24)
GFR calc Af Amer: >60 mL/min (ref >60)
GFR calc non Af Amer: >60 mL/min (ref >60)
GLUCOSE: 117 mg/dL — AB (ref 70–99)
POTASSIUM: 4.7 mmol/L (ref 3.5–5.1)
Sodium: 139 mmol/L (ref 135–145)
TOTAL PROTEIN: 8.5 g/dL — AB (ref 6.5–8.1)

## 2018-01-13 LAB — LACTATE DEHYDROGENASE: LDH: 146 U/L (ref 98–192)

## 2018-01-13 LAB — VITAMIN B12: Vitamin B-12: 378 pg/mL (ref 180–914)

## 2018-01-13 LAB — FOLATE: FOLATE: 13.6 ng/mL (ref >5.9)

## 2018-01-13 LAB — IRON AND TIBC
Iron: 19 ug/dL — ABNORMAL LOW (ref 45–182)
Saturation Ratios: 6 % — ABNORMAL LOW (ref 17.9–39.5)
TIBC: 348 ug/dL (ref 250–450)
UIBC: 329 ug/dL

## 2018-01-13 LAB — TSH: TSH: 0.996 u[IU]/mL (ref 0.350–4.500)

## 2018-01-13 LAB — FERRITIN: Ferritin: 41 ng/mL (ref 24–336)

## 2018-01-13 NOTE — Progress Notes (Signed)
Patient here for initial evaluation. Pt requesting ensure, he in unable to afford.

## 2018-01-14 DIAGNOSIS — D509 Iron deficiency anemia, unspecified: Secondary | ICD-10-CM | POA: Insufficient documentation

## 2018-01-14 LAB — HIV ANTIBODY (ROUTINE TESTING W REFLEX): HIV Screen 4th Generation wRfx: NONREACTIVE

## 2018-01-14 LAB — PROTEIN ELECTROPHORESIS, SERUM
A/G RATIO SPE: 1 (ref 0.7–1.7)
ALPHA-2-GLOBULIN: 0.9 g/dL (ref 0.4–1.0)
Albumin ELP: 3.5 g/dL (ref 2.9–4.4)
Alpha-1-Globulin: 0.4 g/dL (ref 0.0–0.4)
BETA GLOBULIN: 0.9 g/dL (ref 0.7–1.3)
GAMMA GLOBULIN: 1.4 g/dL (ref 0.4–1.8)
Globulin, Total: 3.6 g/dL (ref 2.2–3.9)
Total Protein ELP: 7.1 g/dL (ref 6.0–8.5)

## 2018-01-14 NOTE — Progress Notes (Signed)
Hematology/Oncology Consult note [facility] Telephone:([phone] [phone]   Patient Care Team: Kirk Ruths, MD as PCP - General (Internal Medicine)  REFERRING PROVIDER: Waylan Rocher CHIEF COMPLAINTS/REASON FOR VISIT:  Evaluation of pancytopenia  HISTORY OF PRESENTING ILLNESS:  [name] is a  72 y.o.  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 Patient has been pancytopenic chronically, previous hematology evaluation was obtained remotely, with a bone marrow biopsy.  Patient has multiple chronic problems and follows up mainly at [facility]. Extensive chart review of medical charts in [facility] through care everywhere was performed by me. #Rheumatology problem: Systemic lupus was diagnosed in 2006, patient was started on Imuran in 2007 and discontinued in 2008 due to leukopenia.  He was also on Plaquenil which was discontinued 2007 and discontinued due to cost issue To establish care with [facility] currently on prednisone  #Patient also had chronic hepatitis C history, status post Ribavirin interferon alfa treatment.  Hepatitis viral load undetectable last tested in January 2019. #Weight loss, chronic problem for patient.  Reports appetite is continued to have weight loss #Chronic bilateral lower extremity swelling, no aggravating or alleviating factors. #Last colonoscopy in January 2019 which showed diverticulosis and colon polyps. #Easy bruising: Chronic, waxing and waning.  Patient showed me an area of severe bruising after a minor trauma of his left elbow. Denies hematochezia, hematuria, hematemesis, epistaxis, black tarry stool.  Fatigue: reports worsening fatigue. Chronic onset, perisistent, no aggravating or improving factors, no associated symptoms.   Review of Systems  Constitutional: Positive for malaise/fatigue and weight loss. Negative for chills and fever.  HENT: Negative  for congestion, ear discharge, ear pain, nosebleeds, sinus pain and sore throat.   Eyes: Negative for double vision, photophobia, pain, discharge and redness.  Respiratory: Negative for cough, hemoptysis, sputum production, shortness of breath and wheezing.   Cardiovascular: Negative for chest pain, palpitations, orthopnea, claudication and leg swelling.  Gastrointestinal: Negative for abdominal pain, blood in stool, constipation, diarrhea, heartburn, melena, nausea and vomiting.  Genitourinary: Negative for dysuria, flank pain, frequency and hematuria.  Musculoskeletal: Negative for back pain, myalgias and neck pain.       Leg swelling  Skin: Negative for itching and rash.  Neurological: Negative for dizziness, tingling, tremors, focal weakness, weakness and headaches.  Endo/Heme/Allergies: Negative for environmental allergies. Bruises/bleeds easily.  Psychiatric/Behavioral: Negative for depression and hallucinations. The patient is not nervous/anxious.     MEDICAL HISTORY:  Past Medical History:  Diagnosis Date  . Arthritis    RHEUMATOID  . Dyspnea   . Genital herpes   . Hepatitis    HEP.C  . Hepatitis C   . Hypercholesteremia   . Hypertension   . Lupus ([place])   . Pancytopenia ([place])   . Sjogren's syndrome ([place])     SURGICAL HISTORY: Past Surgical History:  Procedure Laterality Date  . COLONOSCOPY    . COLONOSCOPY WITH PROPOFOL N/A 07/14/2017   Procedure: COLONOSCOPY WITH PROPOFOL;  Surgeon: Lollie Sails, MD;  Location: [facility] ENDOSCOPY;  Service: Endoscopy;  Laterality: N/A;  . HERNIA REPAIR Left   . INGUINAL HERNIA REPAIR Right 08/04/2017   Procedure: HERNIA REPAIR INGUINAL ADULT;  Surgeon: Leonie Green, MD;  Location: [facility] ORS;  Service: General;  Laterality: Right;  . KNEE ARTHROSCOPY      SOCIAL HISTORY: Social History   Socioeconomic History  . Marital status: Married    Spouse name: Not on file  .  Number of children: Not on file  . Years of education:  Not on file  . Highest education level: Not on file  Occupational History  . Not on file  Social Needs  . Financial resource strain: Not on file  . Food insecurity:    Worry: Not on file    Inability: Not on file  . Transportation needs:    Medical: Not on file    Non-medical: Not on file  Tobacco Use  . Smoking status: Current Every Day Smoker    Packs/day: 0.50  . Smokeless tobacco: Never Used  Substance and Sexual Activity  . Alcohol use: No    Frequency: Never  . 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 Sexual activity: Not on file  Lifestyle  . Physical activity:    Days per week: Not on file    Minutes per session: Not on file  . Stress: Not on file  Relationships  . Social connections:    Talks on phone: Not on file    Gets together: Not on file    Attends religious service: Not on file    Active member of club or organization: Not on file    Attends meetings of clubs or organizations: Not on file    Relationship status: Not on file  . Intimate partner violence:    Fear of current or ex partner: Not on file    Emotionally abused: Not on file    Physically abused: Not on file    Forced sexual activity: Not on file  Other Topics Concern  . Not on file  Social History Narrative  . Not on file    FAMILY HISTORY: History reviewed. No pertinent family history.  ALLERGIES:  is allergic to eggs or egg-derived products; penicillins; and shellfish-derived products.  MEDICATIONS:  Current Outpatient Medications  Medication Sig Dispense Refill  . amLODipine (NORVASC) 2.5 MG tablet Take 2.5 mg by mouth daily.    . diazepam (VALIUM) 5 MG tablet Take 5 mg by mouth 2 (two) times daily.    Marland Kitchen lisinopril (PRINIVIL,ZESTRIL) 20 MG tablet Take 20 mg by mouth daily.    . pravastatin (PRAVACHOL) 20 MG tablet Take 20 mg by mouth at bedtime.     . predniSONE (DELTASONE) 10 MG tablet 1 tab daily, 30 days    . traMADol (ULTRAM) 50 MG tablet Take 50 mg by mouth  every 6 (six) hours as needed for moderate pain.    Marland Kitchen HYDROcodone-acetaminophen (NORCO) 5-325 MG tablet Take 1-2 tablets by mouth every 6 (six) hours as needed for moderate pain. (Patient not taking: Reported on 01/13/2018) 12 tablet 0  . ibuprofen (ADVIL,MOTRIN) 200 MG tablet Take 400-800 mg by mouth every 6 (six) hours as needed for headache or moderate pain.    . sildenafil (VIAGRA) 100 MG tablet Take 100 mg by mouth daily as needed for erectile dysfunction.     No current facility-administered medications for this visit.      PHYSICAL EXAMINATION: ECOG PERFORMANCE STATUS: 1 - Symptomatic but completely ambulatory Vitals:   01/13/18 1525  BP: 110/69  Pulse: 97  Resp: 18  Temp: 97.6 F (36.4 C)   Filed Weights   01/13/18 1525  Weight: 126 lb (57.2 kg)    Physical Exam  Constitutional: He is oriented to person, place, and time. He appears well-developed. No distress.  thin  HENT:  Head: Normocephalic and atraumatic.  Right Ear: External ear normal.  Left Ear: External ear normal.  Eyes: Pupils are equal, round, and reactive to light. EOM are normal. No scleral icterus.  Neck: Normal range of motion. Neck supple.  Cardiovascular: Normal rate, regular rhythm and normal heart sounds.  Pulmonary/Chest: Effort normal and breath sounds normal. No respiratory distress. He has no wheezes. He has no rales. He exhibits no tenderness.  Abdominal: Soft. Bowel sounds are normal. He exhibits no distension. There is no tenderness.  Musculoskeletal: Normal range of motion. He exhibits edema. He exhibits no deformity.  Neurological: He is alert and oriented to person, place, and time. No cranial nerve deficit. Coordination normal.  Skin: Skin is warm and dry. No rash noted.  Large Area of bruising/echymosis,around left elbow. No hematoma.   Another smaller area of bruising/ecchymosis 2cm x 2 cm right elbow.   Psychiatric: He has a normal mood and affect. His behavior is normal. Thought  content normal.       LABORATORY DATA:  I have reviewed the data as listed Lab Results  Component Value Date   WBC 3.8 01/13/2018   HGB 9.4 (L) 01/13/2018   HCT 29.3 (L) 01/13/2018   MCV 64.9 (L) 01/13/2018   PLT 127 (L) 01/13/2018   Recent Labs    01/13/18 1609  NA 139  K 4.7  CL 102  CO2 28  GLUCOSE 117*  BUN 25*  CREATININE 0.73  CALCIUM 8.8*  GFRNONAA >60  GFRAA >60  PROT 8.5*  ALBUMIN 4.1  AST 18  ALT 8  ALKPHOS 50  BILITOT 1.2   Iron/TIBC/Ferritin/ %Sat    Component Value Date/Time   IRON 19 (L) 01/13/2018 1609   TIBC 348 01/13/2018 1609   FERRITIN 41 01/13/2018 1609   IRONPCTSAT 6 (L) 01/13/2018 1609     RADIOGRAPHIC STUDIES: I have personally reviewed the radiological images as listed and agreed with the findings in the report. 09/05/2016 2D echoL LVEF 55% 6/20/2017retroperitoneal [place]:  renal cysts, enlarged prostate.  07/29/2017 US abdomen: gallstone without acute cholecystitis, normal liver, spleen, pancrease. Renal cysts.   ASSESSMENT & PLAN:  1. Other pancytopenia (HCC)   2. Easy bruising   3. Lupus ([place])   4. Microcytic anemia   5. Iron deficiency anemia, unspecified iron deficiency anemia type   6. Thrombocytopenia ([place])    For the work up of patient's pancytopenia, I recommend checking CBC;CMP, LDH; pathology smear review, folate, Vitamin B12,  HIV, monoclonal gammopathy workup.  Patient also has constitutional symptoms, ? From autoimmune disease vs underlying malignancy.  Also, discussed with the patient that if no clear etiology found- bone marrow biopsy would be suggested. Currently await for the above workup.   Easy bruising, due to thrombocytopenia or coagulopathy. Check PT/PTT Work up labs reviewed. Microcytic anemia, iron panel consistent with iron deficiency. Start IV iron with venofer weekly x 4   Borderline B12 level, will start trial of parental B12 injection.  If above not able to improve patient's counts, plan repeat bone  marrow biopsy.   Orders Placed This Encounter  Procedures  . CT Biopsy    Standing Status:   Future    Standing Expiration Date:   01/13/2019    Order Specific Question:   Lab orders requested (DO NOT place separate lab orders, these will be automatically ordered during procedure specimen collection):    Answer:   Surgical Pathology    Comments:   bone marrow biopsy    Order Specific Question:   Reason for Exam (SYMPTOM  OR DIAGNOSIS  REQUIRED)    Answer:   pancytopenia    Order Specific Question:   Preferred imaging location?    Answer:   [facility]    Order Specific Question:   Radiology Contrast Protocol - do NOT remove file path    Answer:   [email or web address]  . CBC with Differential/Platelet    Standing Status:   Future    Number of Occurrences:   1    Standing Expiration Date:   01/14/2019  . Comprehensive metabolic panel    Standing Status:   Future    Number of Occurrences:   1    Standing Expiration Date:   01/14/2019  . Lactate dehydrogenase    Standing Status:   Future    Number of Occurrences:   1    Standing Expiration Date:   01/14/2019  . HIV antibody    Standing Status:   Future    Number of Occurrences:   1    Standing Expiration Date:   01/14/2019  . Folate    Standing Status:   Future    Number of Occurrences:   1    Standing Expiration Date:   01/14/2019  . Vitamin B12    Standing Status:   Future    Number of Occurrences:   1    Standing Expiration Date:   01/14/2019  . Iron and TIBC    Standing Status:   Future    Number of Occurrences:   1    Standing Expiration Date:   01/14/2019  . Ferritin    Standing Status:   Future    Number of Occurrences:   1    Standing Expiration Date:   01/14/2019  . Flow cytometry panel-leukemia/lymphoma work-up    Standing Status:   Future    Number of Occurrences:   1    Standing Expiration Date:   01/14/2019  . Protein electrophoresis, serum    Standing Status:   Future    Number of  Occurrences:   1    Standing Expiration Date:   01/14/2019  . TSH    Standing Status:   Future    Number of Occurrences:   1    Standing Expiration Date:   01/14/2019    All questions were answered. The patient knows to call the clinic with any problems questions or concerns. Cc Dr.Bock Return of visit: 1 week  Thank you for this kind referral and the opportunity to participate in the care of this patient. A copy of today's note is routed to referring provider  Total face to face encounter time for this patient visit was 60 min. >50% of the time was  spent in counseling and coordination of care.    Earlie Server, MD, PhD Hematology Oncology [facility] Pager- [phone] 01/14/2018

## 2018-01-18 ENCOUNTER — Telehealth: Payer: Self-pay | Admitting: *Deleted

## 2018-01-18 LAB — COMP PANEL: LEUKEMIA/LYMPHOMA

## 2018-01-18 NOTE — Telephone Encounter (Signed)
Called patient with Dr. Collie Siad message. He verbalized understanding.

## 2018-01-18 NOTE — Telephone Encounter (Signed)
I have not started him on any medication yet. Low platelet working up in progress.  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

## 2018-01-18 NOTE — Telephone Encounter (Signed)
Patient's wife called to ask about prednisone Script. She states that they were told in clinic that he was supposed to start taking medication today however the pharmacy does not have prescription for medication.

## 2018-01-19 ENCOUNTER — Inpatient Hospital Stay (HOSPITAL_BASED_OUTPATIENT_CLINIC_OR_DEPARTMENT_OTHER): Payer: Medicare HMO | Admitting: Oncology

## 2018-01-19 ENCOUNTER — Inpatient Hospital Stay: Payer: Medicare HMO

## 2018-01-19 ENCOUNTER — Encounter: Payer: Self-pay | Admitting: Oncology

## 2018-01-19 ENCOUNTER — Other Ambulatory Visit: Payer: Self-pay

## 2018-01-19 VITALS — BP 119/73 | HR 82 | Temp 98.7°F | Resp 18 | Wt 123.1 lb

## 2018-01-19 VITALS — BP 146/83 | HR 72 | Temp 97.1°F | Resp 18

## 2018-01-19 DIAGNOSIS — D61818 Other pancytopenia: Secondary | ICD-10-CM

## 2018-01-19 DIAGNOSIS — M329 Systemic lupus erythematosus, unspecified: Secondary | ICD-10-CM

## 2018-01-19 DIAGNOSIS — Z79899 Other long term (current) drug therapy: Secondary | ICD-10-CM

## 2018-01-19 DIAGNOSIS — R233 Spontaneous ecchymoses: Secondary | ICD-10-CM

## 2018-01-19 DIAGNOSIS — D509 Iron deficiency anemia, unspecified: Secondary | ICD-10-CM

## 2018-01-19 DIAGNOSIS — R238 Other skin changes: Secondary | ICD-10-CM

## 2018-01-19 DIAGNOSIS — D72820 Lymphocytosis (symptomatic): Secondary | ICD-10-CM | POA: Diagnosis not present

## 2018-01-19 DIAGNOSIS — D696 Thrombocytopenia, unspecified: Secondary | ICD-10-CM

## 2018-01-19 LAB — PROTIME-INR
INR: 1.11
PROTHROMBIN TIME: 14.2 s (ref 11.4–15.2)

## 2018-01-19 LAB — APTT: aPTT: 31 seconds (ref 24–36)

## 2018-01-19 MED ORDER — SODIUM CHLORIDE 0.9 % IV SOLN
Freq: Once | INTRAVENOUS | Status: AC
  Administered 2018-01-19: 15:00:00 via INTRAVENOUS
  Filled 2018-01-19: qty 1000

## 2018-01-19 MED ORDER — CYANOCOBALAMIN 1000 MCG/ML IJ SOLN
1000.0000 ug | Freq: Once | INTRAMUSCULAR | Status: AC
  Administered 2018-01-19: 1000 ug via INTRAMUSCULAR
  Filled 2018-01-19: qty 1

## 2018-01-19 MED ORDER — IRON SUCROSE 20 MG/ML IV SOLN
200.0000 mg | Freq: Once | INTRAVENOUS | Status: AC
  Administered 2018-01-19: 200 mg via INTRAVENOUS
  Filled 2018-01-19: qty 10

## 2018-01-19 NOTE — Progress Notes (Signed)
Patient here for follow up. Patient states he has labored breathing with minimal exercise. Patient's wife requesting for patient to sign release so we can get medical records from [facility].

## 2018-01-19 NOTE — Progress Notes (Addendum)
Hematology/Oncology follow up Note [facility] [phone] [phone]   Patient Care Team: Anderson, Marshall W, MD as PCP - General (Internal Medicine)  REFERRING PROVIDER: Dr.Behalal Bock REASON FOR VISIT Follow up for mangement of pancytopenia  HISTORY OF PRESENTING ILLNESS:  Juan Nguyen  is Nguyen  72 y.o.  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 Patient has been pancytopenic chronically, previous hematology evaluation was obtained remotely, with Nguyen bone marrow biopsy.  Patient has multiple chronic problems and follows up mainly at [facility]. Extensive chart review of medical charts in [facility] through care everywhere was performed by me. #Rheumatology problem: Systemic lupus was diagnosed in 2006, patient was started on Imuran in 2007 and discontinued in 2008 due to leukopenia.  He was also on Plaquenil which was discontinued 2007 and discontinued due to cost issue To establish care with [facility] currently on prednisone  Patient also had chronic hepatitis C history, status post Ribavirin interferon alfa treatment.  Hepatitis viral load undetectable last tested in January 2019. #Chronic bilateral lower extremity swelling, no aggravating or alleviating factors. Weight loss, chronic problem for patient.  Reports appetite is continued to have weight loss  #Last colonoscopy in January 2019 which showed diverticulosis and colon polyps. #Easy bruising: Chronic, waxing and waning.  Patient showed me an area of severe bruising after Nguyen minor trauma of his left elbow. Denies hematochezia, hematuria, # hematemesis, epistaxis, black tarry stool.   INTERVAL HISTORY Diangelo Nguyen  is Nguyen 72 y.o. male who has above history reviewed by me today presents for follow up visit for management of pancytopenia. Fatigue: reports worsening fatigue. Chronic onset, perisistent, no aggravating or improving factors,  no associated symptoms.  Continue to have easy bruising.  Review of Systems  Constitutional: Positive for malaise/fatigue and weight loss. Negative for chills and fever.  HENT: Negative for congestion, ear discharge, ear pain, nosebleeds, sinus pain and sore throat.   Eyes: Negative for double vision, photophobia, pain, discharge and redness.  Respiratory: Negative for cough, hemoptysis, sputum production, shortness of breath and wheezing.   Cardiovascular: Negative for chest pain, palpitations, orthopnea, claudication and leg swelling.  Gastrointestinal: Negative for abdominal pain, blood in stool, constipation, diarrhea, heartburn, melena, nausea and vomiting.  Genitourinary: Negative for dysuria, flank pain, frequency and hematuria.  Musculoskeletal: Negative for back pain, myalgias and neck pain.       Leg swelling  Skin: Negative for itching and rash.  Neurological: Negative for dizziness, tingling, tremors, focal weakness, weakness and headaches.  Endo/Heme/Allergies: Negative for environmental allergies. Bruises/bleeds easily.  Psychiatric/Behavioral: Negative for depression and hallucinations. The patient is not nervous/anxious.     MEDICAL HISTORY:  Past Medical History:  Diagnosis Date  . Arthritis    RHEUMATOID  . Dyspnea   . Genital herpes   . Hepatitis    HEP.C  . Hepatitis C   . Hypercholesteremia   . Hypertension   . Lupus (HCC)   . Pancytopenia (HCC)   . Sjogren's syndrome (HCC)     SURGICAL HISTORY: Past Surgical History:  Procedure Laterality Date  . COLONOSCOPY    . COLONOSCOPY WITH PROPOFOL N/Nguyen 07/14/2017   Procedure: COLONOSCOPY WITH PROPOFOL;  Surgeon: Skulskie, Martin U, MD;  Location: [facility] ENDOSCOPY;  Service: Endoscopy;  Laterality: N/Nguyen;  . HERNIA REPAIR Left   . INGUINAL HERNIA REPAIR Right 08/04/2017   Procedure: HERNIA REPAIR INGUINAL ADULT;  Surgeon: Smith, Jarvis Wilton, MD;  Location: [facility]   ORS;  Service: General;  Laterality: Right;  . KNEE  ARTHROSCOPY      SOCIAL HISTORY: Social History   Socioeconomic History  . Marital status: Married    Spouse name: Not on file  . Number of children: Not on file  . Years of education: Not on file  . Highest education level: Not on file  Occupational History  . Not on file  Social Needs  . Financial resource strain: Not on file  . Food insecurity:    Worry: Not on file    Inability: Not on file  . Transportation needs:    Medical: Not on file    Non-medical: Not on file  Tobacco Use  . Smoking status: Current Every Day Smoker    Packs/day: 0.50  . Smokeless tobacco: Never Used  Substance and Sexual Activity  . Alcohol use: No    Frequency: Never  . 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 Sexual activity: Not on file  Lifestyle  . Physical activity:    Days per week: Not on file    Minutes per session: Not on file  . Stress: Not on file  Relationships  . Social connections:    Talks on phone: Not on file    Gets together: Not on file    Attends religious service: Not on file    Active member of club or organization: Not on file    Attends meetings of clubs or organizations: Not on file    Relationship status: Not on file  . Intimate partner violence:    Fear of current or ex partner: Not on file    Emotionally abused: Not on file    Physically abused: Not on file    Forced sexual activity: Not on file  Other Topics Concern  . Not on file  Social History Narrative  . Not on file    FAMILY HISTORY: History reviewed. No pertinent family history.  ALLERGIES:  is allergic to eggs or egg-derived products; penicillins; and shellfish-derived products.  MEDICATIONS:  Current Outpatient Medications  Medication Sig Dispense Refill  . amLODipine (NORVASC) 2.5 MG tablet Take 2.5 mg by mouth daily.    . diazepam (VALIUM) 5 MG tablet Take 5 mg by mouth 2 (two) times daily.    Marland Kitchen ibuprofen (ADVIL,MOTRIN) 200 MG tablet Take 400-800 mg by mouth every 6  (six) hours as needed for headache or moderate pain.    Marland Kitchen lisinopril (PRINIVIL,ZESTRIL) 20 MG tablet Take 20 mg by mouth daily.    . predniSONE (DELTASONE) 10 MG tablet 1 tab daily, 30 days    . HYDROcodone-acetaminophen (NORCO) 5-325 MG tablet Take 1-2 tablets by mouth every 6 (six) hours as needed for moderate pain. (Patient not taking: Reported on 01/13/2018) 12 tablet 0  . pravastatin (PRAVACHOL) 20 MG tablet Take 20 mg by mouth at bedtime.     . sildenafil (VIAGRA) 100 MG tablet Take 100 mg by mouth daily as needed for erectile dysfunction.    . traMADol (ULTRAM) 50 MG tablet Take 50 mg by mouth every 6 (six) hours as needed for moderate pain.     No current facility-administered medications for this visit.      PHYSICAL EXAMINATION: ECOG PERFORMANCE STATUS: 1 - Symptomatic but completely ambulatory Vitals:   01/19/18 1344  BP: 119/73  Pulse: 82  Resp: 18  Temp: 98.7 F (37.1 C)   Filed Weights   01/19/18 1344  Weight:  123 lb 1.6 oz (55.8 kg)    Physical Exam  Constitutional: He is oriented to person, place, and time. He appears well-developed and well-nourished. No distress.  thin  HENT:  Head: Normocephalic and atraumatic.  Right Ear: External ear normal.  Left Ear: External ear normal.  Mouth/Throat: Oropharynx is clear and moist.  Eyes: Pupils are equal, round, and reactive to light. Conjunctivae and EOM are normal. No scleral icterus.  Neck: Normal range of motion. Neck supple.  Cardiovascular: Normal rate, regular rhythm and normal heart sounds.  Pulmonary/Chest: Effort normal and breath sounds normal. No respiratory distress. He has no wheezes. He has no rales. He exhibits no tenderness.  Abdominal: Soft. Bowel sounds are normal. He exhibits no distension and no mass. There is no tenderness.  Musculoskeletal: Normal range of motion. He exhibits edema. He exhibits no deformity.  Lymphadenopathy:    He has no cervical adenopathy.  Neurological: He is alert and  oriented to person, place, and time. No cranial nerve deficit. Coordination normal.  Skin: Skin is warm and dry. No rash noted.  Large Area of bruising/echymosis,around left elbow. No hematoma.   Another smaller area of bruising/ecchymosis 2cm x 2 cm right elbow.   Psychiatric: He has Nguyen normal mood and affect. His behavior is normal. Thought content normal.    LABORATORY DATA:  I have reviewed the data as listed Lab Results  Component Value Date   WBC 3.8 01/13/2018   HGB 9.4 (L) 01/13/2018   HCT 29.3 (L) 01/13/2018   MCV 64.9 (L) 01/13/2018   PLT 127 (L) 01/13/2018   Recent Labs    01/13/18 1609  NA 139  K 4.7  CL 102  CO2 28  GLUCOSE 117*  BUN 25*  CREATININE 0.73  CALCIUM 8.8*  GFRNONAA >60  GFRAA >60  PROT 8.5*  ALBUMIN 4.1  AST 18  ALT 8  ALKPHOS 50  BILITOT 1.2   Iron/TIBC/Ferritin/ %Sat    Component Value Date/Time   IRON 19 (L) 01/13/2018 1609   TIBC 348 01/13/2018 1609   FERRITIN 41 01/13/2018 1609   IRONPCTSAT 6 (L) 01/13/2018 1609     RADIOGRAPHIC STUDIES: I have personally reviewed the radiological images as listed and agreed with the findings in the report. 09/05/2016 2D echoL LVEF 55% 6/20/2017retroperitoneal US:  renal cysts, enlarged prostate.  07/29/2017 US abdomen: gallstone without acute cholecystitis, normal liver, spleen, pancrease. Renal cysts.   ASSESSMENT & PLAN:  1. Other pancytopenia (HCC)   2. Monoclonal B-cell lymphocytosis   Labs reviewed and discussed with patient.  Workup showed iron deficiency, Iron saturation 6. As well as low normal limit vitamin B12.  Recommend IV iron Venofer weekly x 4.  Plan IV iron with Venofer 200mg weekly x 4 doses. Allergy reactions/infusion reaction including anaphylactic reaction discussed with patient. Other side effects include but not limited to high blood pressure, skin rash, weight gain, leg swelling, etc. Patient voices understanding and willing to proceed.  Will also give parental B12  injection weekly x 4.  If above not able to improve patient's counts, plan repeat bone marrow biopsy His constitutional symptom most likely due to autoimmune disorders.   For the work up of patient's pancytopenia, I recommend checking CBC;CMP, LDH; pathology smear review, folate, Vitamin B12,  HIV, monoclonal gammopathy workup.  Patient also has constitutional symptoms, ? From autoimmune disease vs underlying malignancy.   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 No treatment needed.   Watchful waiting.  .   Orders Placed This Encounter  Procedures  . CBC with Differential/Platelet    Standing Status:   Future    Standing Expiration Date:   01/20/2019  . Iron and TIBC    Standing Status:   Future    Standing Expiration Date:   01/20/2019  . Ferritin    Standing Status:   Future    Standing Expiration Date:   01/20/2019  . Vitamin B12    Standing Status:   Future    Standing Expiration Date:   01/20/2019    All questions were answered. The patient knows to call the clinic with any problems questions or concerns. Cc Dr.Bock Return of visit: 5 weeks.  Total face to face encounter time for this patient visit was 39mn. >50% of the time was  spent in counseling and coordination of care.   ZEarlie Server MD, PhD Hematology Oncology CNem[facility] Hospitalat AWen[facility] Lake AscPager- [phone]/16/2019

## 2018-01-21 ENCOUNTER — Other Ambulatory Visit: Payer: Self-pay | Admitting: Gastroenterology

## 2018-01-21 ENCOUNTER — Telehealth: Payer: Self-pay | Admitting: *Deleted

## 2018-01-21 ENCOUNTER — Ambulatory Visit: Admission: RE | Admit: 2018-01-21 | Payer: Medicare HMO | Source: Ambulatory Visit

## 2018-01-21 DIAGNOSIS — Z720 Tobacco use: Secondary | ICD-10-CM

## 2018-01-21 DIAGNOSIS — R634 Abnormal weight loss: Secondary | ICD-10-CM

## 2018-01-21 DIAGNOSIS — D509 Iron deficiency anemia, unspecified: Secondary | ICD-10-CM

## 2018-01-21 NOTE — Telephone Encounter (Signed)
Kristi the NP at the GI practice would like to speak with Dr. Tasia Catchings regarding this patient. Cell [phone]

## 2018-01-21 NOTE — Telephone Encounter (Signed)
Wife called to say they forgot his CT guided Biopsy this morning, She would like to talk to someone regarding the missed test.

## 2018-01-22 NOTE — Telephone Encounter (Signed)
Returned call to patient, reminded them that the CT biopsy was cancelled per Dr Collie Siad request

## 2018-01-26 ENCOUNTER — Inpatient Hospital Stay: Payer: Medicare HMO

## 2018-01-26 VITALS — BP 130/80 | HR 74 | Temp 97.3°F | Resp 20

## 2018-01-26 DIAGNOSIS — D61818 Other pancytopenia: Secondary | ICD-10-CM | POA: Diagnosis not present

## 2018-01-26 DIAGNOSIS — D509 Iron deficiency anemia, unspecified: Secondary | ICD-10-CM

## 2018-01-26 MED ORDER — CYANOCOBALAMIN 1000 MCG/ML IJ SOLN
1000.0000 ug | Freq: Once | INTRAMUSCULAR | Status: AC
  Administered 2018-01-26: 1000 ug via INTRAMUSCULAR
  Filled 2018-01-26: qty 1

## 2018-01-26 MED ORDER — SODIUM CHLORIDE 0.9 % IV SOLN
Freq: Once | INTRAVENOUS | Status: AC
  Administered 2018-01-26: 14:00:00 via INTRAVENOUS
  Filled 2018-01-26: qty 1000

## 2018-01-26 MED ORDER — IRON SUCROSE 20 MG/ML IV SOLN
200.0000 mg | Freq: Once | INTRAVENOUS | Status: AC
  Administered 2018-01-26: 200 mg via INTRAVENOUS
  Filled 2018-01-26: qty 10

## 2018-01-27 ENCOUNTER — Telehealth: Payer: Self-pay | Admitting: *Deleted

## 2018-01-27 NOTE — Telephone Encounter (Addendum)
Patient s/o called to report that patient is having pain in ankles and he had an iron infusion yesterday. She is asking if this is related and what can be done for him. Please advise

## 2018-01-27 NOTE — Telephone Encounter (Signed)
Per Dr. Tasia Catchings, Take Tylenol 628m q 6 hrs PRN for pain.

## 2018-01-27 NOTE — Telephone Encounter (Signed)
Phone call returned to patient and wife and instructed that joint pain is a SE of iron inf and dper Dr Tasia Catchings take Tylenol 650 mg every 6 hours as needed. He and the wife repeated back to me and trhe wife had asked if he could take ibuprofen instead to which I answered no explaining that it can in some instances cause GI irritation and bleeding

## 2018-01-29 ENCOUNTER — Ambulatory Visit
Admission: RE | Admit: 2018-01-29 | Discharge: 2018-01-29 | Disposition: A | Discharge status: Home / Self Care | Payer: Medicare HMO | Source: Ambulatory Visit | Attending: Gastroenterology | Admitting: Gastroenterology

## 2018-01-29 DIAGNOSIS — Z720 Tobacco use: Secondary | ICD-10-CM | POA: Insufficient documentation

## 2018-01-29 DIAGNOSIS — I700 Atherosclerosis of aorta: Secondary | ICD-10-CM | POA: Insufficient documentation

## 2018-01-29 DIAGNOSIS — K8020 Calculus of gallbladder without cholecystitis without obstruction: Secondary | ICD-10-CM | POA: Insufficient documentation

## 2018-01-29 DIAGNOSIS — D509 Iron deficiency anemia, unspecified: Secondary | ICD-10-CM | POA: Diagnosis present

## 2018-01-29 DIAGNOSIS — I2510 Atherosclerotic heart disease of native coronary artery without angina pectoris: Secondary | ICD-10-CM | POA: Diagnosis not present

## 2018-01-29 DIAGNOSIS — J439 Emphysema, unspecified: Secondary | ICD-10-CM | POA: Insufficient documentation

## 2018-01-29 DIAGNOSIS — N2889 Other specified disorders of kidney and ureter: Secondary | ICD-10-CM | POA: Insufficient documentation

## 2018-01-29 DIAGNOSIS — R634 Abnormal weight loss: Secondary | ICD-10-CM | POA: Diagnosis not present

## 2018-01-29 MED ORDER — IOPAMIDOL (ISOVUE-300) INJECTION 61%
100.0000 mL | Freq: Once | INTRAVENOUS | Status: AC | PRN
  Administered 2018-01-29: 50 mL via INTRAVENOUS

## 2018-02-02 ENCOUNTER — Inpatient Hospital Stay: Payer: Medicare HMO

## 2018-02-02 VITALS — BP 107/73 | HR 88 | Temp 98.8°F | Resp 18

## 2018-02-02 DIAGNOSIS — D61818 Other pancytopenia: Secondary | ICD-10-CM | POA: Diagnosis not present

## 2018-02-02 DIAGNOSIS — D509 Iron deficiency anemia, unspecified: Secondary | ICD-10-CM

## 2018-02-02 MED ORDER — SODIUM CHLORIDE 0.9 % IV SOLN
Freq: Once | INTRAVENOUS | Status: AC
  Administered 2018-02-02: 14:00:00 via INTRAVENOUS
  Filled 2018-02-02: qty 1000

## 2018-02-02 MED ORDER — CYANOCOBALAMIN 1000 MCG/ML IJ SOLN
1000.0000 ug | Freq: Once | INTRAMUSCULAR | Status: AC
  Administered 2018-02-02: 1000 ug via INTRAMUSCULAR
  Filled 2018-02-02: qty 1

## 2018-02-02 MED ORDER — IRON SUCROSE 20 MG/ML IV SOLN
200.0000 mg | Freq: Once | INTRAVENOUS | Status: AC
  Administered 2018-02-02: 200 mg via INTRAVENOUS
  Filled 2018-02-02: qty 10

## 2018-02-02 NOTE — Progress Notes (Signed)
Pt presents today with c/o significant foot and ankle pain after his last venofer infusion as well as mild swelling.  Faythe Casa NP to chairside to assess pt.  Okay to proceed with venofer infusion today per Dr. Tasia Catchings per Quay Burow.

## 2018-02-22 ENCOUNTER — Inpatient Hospital Stay: Payer: Medicare HMO | Attending: Oncology

## 2018-02-22 DIAGNOSIS — D509 Iron deficiency anemia, unspecified: Secondary | ICD-10-CM | POA: Insufficient documentation

## 2018-02-22 DIAGNOSIS — D72820 Lymphocytosis (symptomatic): Secondary | ICD-10-CM | POA: Diagnosis not present

## 2018-02-22 DIAGNOSIS — D696 Thrombocytopenia, unspecified: Secondary | ICD-10-CM | POA: Insufficient documentation

## 2018-02-22 DIAGNOSIS — Z8619 Personal history of other infectious and parasitic diseases: Secondary | ICD-10-CM | POA: Diagnosis not present

## 2018-02-22 DIAGNOSIS — D61818 Other pancytopenia: Secondary | ICD-10-CM | POA: Insufficient documentation

## 2018-02-22 LAB — VITAMIN B12: Vitamin B-12: 473 pg/mL (ref 180–914)

## 2018-02-22 LAB — CBC WITH DIFFERENTIAL/PLATELET
Basophils Absolute: 0 10*3/uL (ref 0–0.1)
Basophils Relative: 0 %
EOS ABS: 0 10*3/uL (ref 0–0.7)
Eosinophils Relative: 0 %
HCT: 32.4 % — ABNORMAL LOW (ref 40.0–52.0)
HEMOGLOBIN: 10.3 g/dL — AB (ref 13.0–18.0)
LYMPHS ABS: 0.3 10*3/uL — AB (ref 1.0–3.6)
Lymphocytes Relative: 6 %
MCH: 21.8 pg — AB (ref 26.0–34.0)
MCHC: 31.6 g/dL — ABNORMAL LOW (ref 32.0–36.0)
MCV: 69 fL — ABNORMAL LOW (ref 80.0–100.0)
Monocytes Absolute: 0.3 10*3/uL (ref 0.2–1.0)
Monocytes Relative: 5 %
NEUTROS PCT: 89 %
Neutro Abs: 4.4 10*3/uL (ref 1.4–6.5)
Platelets: 86 10*3/uL — ABNORMAL LOW (ref 150–400)
RBC: 4.71 MIL/uL (ref 4.40–5.90)
RDW: 23.5 % — ABNORMAL HIGH (ref 11.5–14.5)
WBC: 5 10*3/uL (ref 3.8–10.6)

## 2018-02-22 LAB — IRON AND TIBC
Iron: 19 ug/dL — ABNORMAL LOW (ref 45–182)
Saturation Ratios: 7 % — ABNORMAL LOW (ref 17.9–39.5)
TIBC: 282 ug/dL (ref 250–450)
UIBC: 263 ug/dL

## 2018-02-22 LAB — FERRITIN: Ferritin: 73 ng/mL (ref 24–336)

## 2018-02-24 ENCOUNTER — Inpatient Hospital Stay (HOSPITAL_BASED_OUTPATIENT_CLINIC_OR_DEPARTMENT_OTHER): Payer: Medicare HMO | Admitting: Oncology

## 2018-02-24 ENCOUNTER — Inpatient Hospital Stay: Payer: Medicare HMO

## 2018-02-24 ENCOUNTER — Encounter: Payer: Self-pay | Admitting: Oncology

## 2018-02-24 ENCOUNTER — Other Ambulatory Visit: Payer: Self-pay

## 2018-02-24 ENCOUNTER — Telehealth: Payer: Self-pay | Admitting: *Deleted

## 2018-02-24 VITALS — BP 106/70 | HR 86 | Temp 97.9°F | Resp 18 | Wt 123.8 lb

## 2018-02-24 DIAGNOSIS — D61818 Other pancytopenia: Secondary | ICD-10-CM | POA: Diagnosis not present

## 2018-02-24 DIAGNOSIS — Z8619 Personal history of other infectious and parasitic diseases: Secondary | ICD-10-CM

## 2018-02-24 DIAGNOSIS — D509 Iron deficiency anemia, unspecified: Secondary | ICD-10-CM | POA: Diagnosis not present

## 2018-02-24 DIAGNOSIS — D72820 Lymphocytosis (symptomatic): Secondary | ICD-10-CM | POA: Diagnosis not present

## 2018-02-24 DIAGNOSIS — R238 Other skin changes: Secondary | ICD-10-CM

## 2018-02-24 DIAGNOSIS — R233 Spontaneous ecchymoses: Secondary | ICD-10-CM

## 2018-02-24 LAB — CBC WITH DIFFERENTIAL/PLATELET
BASOS PCT: 0 %
Basophils Absolute: 0 10*3/uL (ref 0–0.1)
Eosinophils Absolute: 0 10*3/uL (ref 0–0.7)
Eosinophils Relative: 0 %
HCT: 33.2 % — ABNORMAL LOW (ref 40.0–52.0)
HEMOGLOBIN: 10.3 g/dL — AB (ref 13.0–18.0)
Lymphocytes Relative: 19 %
Lymphs Abs: 0.8 10*3/uL — ABNORMAL LOW (ref 1.0–3.6)
MCH: 21.7 pg — ABNORMAL LOW (ref 26.0–34.0)
MCHC: 31.1 g/dL — ABNORMAL LOW (ref 32.0–36.0)
MCV: 69.8 fL — ABNORMAL LOW (ref 80.0–100.0)
MONOS PCT: 5 %
Monocytes Absolute: 0.2 10*3/uL (ref 0.2–1.0)
NEUTROS PCT: 76 %
Neutro Abs: 3.3 10*3/uL (ref 1.4–6.5)
Platelets: 91 10*3/uL — ABNORMAL LOW (ref 150–400)
RBC: 4.76 MIL/uL (ref 4.40–5.90)
RDW: 23.6 % — ABNORMAL HIGH (ref 11.5–14.5)
Smear Review: DECREASED
WBC: 4.3 10*3/uL (ref 3.8–10.6)

## 2018-02-24 MED ORDER — FERROUS SULFATE 325 (65 FE) MG PO TBEC: 325.0000 mg | DELAYED_RELEASE_TABLET | Freq: Two times a day (BID) | ORAL | 3 refills | Status: DC

## 2018-02-24 NOTE — Progress Notes (Signed)
Hematology/Oncology follow up Note [facility] Telephone:([phone]) [phone] Fax:([phone]) [phone]   Patient Care Team: Kirk Ruths, MD as PCP - General (Internal Medicine)  REFERRING PROVIDER: Waylan Rocher REASON FOR VISIT Follow up for mangement of pancytopenia  HISTORY OF PRESENTING ILLNESS:  Juan Nguyen is a  72 y.o.  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 Patient has been pancytopenic chronically, previous hematology evaluation was obtained remotely, with a bone marrow biopsy.  Patient has multiple chronic problems and follows up mainly at [place]. Extensive chart review of medical charts in [facility] through care everywhere was performed by me. #Rheumatology problem: Systemic lupus was diagnosed in 2006, patient was started on Imuran in 2007 and discontinued in 2008 due to leukopenia.  He was also on Plaquenil which was discontinued 2007 and discontinued due to cost issue To establish care with [facility] currently on prednisone  Patient also had chronic hepatitis C history, status post Ribavirin interferon alfa treatment.  Hepatitis viral load undetectable last tested in January 2019. #Chronic bilateral lower extremity swelling, no aggravating or alleviating factors. Weight loss, chronic problem for patient.  Reports appetite is continued to have weight loss  #Last colonoscopy in January 2019 which showed diverticulosis and colon polyps. #Easy bruising: Chronic, waxing and waning.  Patient showed me an area of severe bruising after a minor trauma of his left elbow. Denies hematochezia, hematuria, # hematemesis, epistaxis, black tarry stool.   INTERVAL HISTORY Juan Nguyen is a 72 y.o. male who has above history reviewed by me today presents for management of pancytopenia.    # S/p IV venofer weekly x 3. Complained about lower extremity swelling after each Venofer dose.  # Continue to have  chronic easy bruising, no active bleeding.  # B12 deficiency, s/p Weekly B12 1055mg x 3 doses.  # Fatigue, continue feeling fatigued. Appetite is poor.  # He was recently seen by Dr.Kernodle. plaquenil stopped and patient is low dose prednisone 159mdaily.   Review of Systems  Constitutional: Positive for malaise/fatigue and weight loss. Negative for chills and fever.  HENT: Negative for congestion, ear discharge, ear pain, nosebleeds, sinus pain and sore throat.   Eyes: Negative for double vision, photophobia, pain, discharge and redness.  Respiratory: Negative for cough, hemoptysis, sputum production, shortness of breath and wheezing.   Cardiovascular: Negative for chest pain, palpitations, orthopnea, claudication and leg swelling.  Gastrointestinal: Negative for abdominal pain, blood in stool, constipation, diarrhea, heartburn, melena, nausea and vomiting.  Genitourinary: Negative for dysuria, flank pain, frequency and hematuria.  Musculoskeletal: Negative for back pain, myalgias and neck pain.       Leg swelling  Skin: Negative for itching and rash.  Neurological: Negative for dizziness, tingling, tremors, focal weakness, weakness and headaches.  Endo/Heme/Allergies: Negative for environmental allergies. Bruises/bleeds easily.  Psychiatric/Behavioral: Negative for depression and hallucinations. The patient is not nervous/anxious.     MEDICAL HISTORY:  Past Medical History:  Diagnosis Date  . Arthritis    RHEUMATOID  . Dyspnea   . Genital herpes   . Hepatitis    HEP.C  . Hepatitis C   . Hypercholesteremia   . Hypertension   . Lupus ([place]  . Pancytopenia ([facility]  . 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 COLONOSCOPY    . COLONOSCOPY WITH PROPOFOL N/A 07/14/2017   Procedure: COLONOSCOPY WITH PROPOFOL;  Surgeon: Lollie Sails, MD;  [facility] END[facility]COPY;  Service: Endoscopy;  Laterality: N/A;  . HERNIA  REPAIR Left   . INGUINAL HERNIA REPAIR Right 08/04/2017   Procedure: HERNIA REPAIR INGUINAL ADULT;  Surgeon: Leonie Green, MD;  Location: [facility] ORS;  Service: General;  Laterality: Right;  . KNEE ARTHROSCOPY      SOCIAL HISTORY: Social History   Socioeconomic History  . Marital status: Married    Spouse name: Not on file  . Number of children: Not on file  . Years of education: Not on file  . Highest education level: Not on file  Occupational History  . Not on file  Social Needs  . Financial resource strain: Not on file  . Food insecurity:    Worry: Not on file    Inability: Not on file  . Transportation needs:    Medical: Not on file    Non-medical: Not on file  Tobacco Use  . Smoking status: Current Every Day Smoker    Packs/day: 0.50  . Smokeless tobacco: Never Used  Substance and Sexual Activity  . Alcohol use: No    Frequency: Never  . 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 Sexual activity: Not on file  Lifestyle  . Physical activity:    Days per week: Not on file    Minutes per session: Not on file  . Stress: Not on file  Relationships  . Social connections:    Talks on phone: Not on file    Gets together: Not on file    Attends religious service: Not on file    Active member of club or organization: Not on file    Attends meetings of clubs or organizations: Not on file    Relationship status: Not on file  . Intimate partner violence:    Fear of current or ex partner: Not on file    Emotionally abused: Not on file    Physically abused: Not on file    Forced sexual activity: Not on file  Other Topics Concern  . Not on file  Social History Narrative  . Not on file    FAMILY HISTORY: History reviewed. No pertinent family history.  ALLERGIES:  is allergic to eggs or egg-derived products; penicillins; and shellfish-derived products.  MEDICATIONS:  Current Outpatient Medications  Medication Sig Dispense Refill  . amLODipine  (NORVASC) 2.5 MG tablet Take 2.5 mg by mouth daily.    . diazepam (VALIUM) 5 MG tablet Take 5 mg by mouth 2 (two) times daily.    Marland Kitchen ibuprofen (ADVIL,MOTRIN) 200 MG tablet Take 400-800 mg by mouth every 6 (six) hours as needed for headache or moderate pain.    Marland Kitchen lisinopril (PRINIVIL,ZESTRIL) 20 MG tablet Take 20 mg by mouth daily.    . predniSONE (DELTASONE) 10 MG tablet 1 tab daily, 30 days    . sildenafil (VIAGRA) 100 MG tablet Take 100 mg by mouth daily as needed for erectile dysfunction.    . ferrous sulfate 325 (65 FE) MG EC tablet Take 1 tablet (325 mg total) by mouth 2 (two) times daily. 30 tablet 3  . HYDROcodone-acetaminophen (NORCO) 5-325 MG tablet Take 1-2 tablets by mouth every 6 (six) hours as needed for moderate pain. (Patient not taking: Reported on 01/13/2018) 12 tablet 0  . pravastatin (PRAVACHOL) 20 MG tablet Take 20 mg by mouth at bedtime.     . traMADol (ULTRAM) 50 MG tablet Take 50  mg by mouth every 6 (six) hours as needed for moderate pain.     No current facility-administered medications for this visit.      PHYSICAL EXAMINATION: ECOG PERFORMANCE STATUS: 1 - Symptomatic but completely ambulatory Vitals:   02/24/18 1348  BP: 106/70  Pulse: 86  Resp: 18  Temp: 97.9 F (36.6 C)   Filed Weights   02/24/18 1348  Weight: 123 lb 12.8 oz (56.2 kg)    Physical Exam  Constitutional: He is oriented to person, place, and time. He appears well-developed and well-nourished. No distress.  thin  HENT:  Head: Normocephalic and atraumatic.  Right Ear: External ear normal.  Left Ear: External ear normal.  Mouth/Throat: Oropharynx is clear and moist.  Eyes: Pupils are equal, round, and reactive to light. Conjunctivae and EOM are normal. No scleral icterus.  Neck: Normal range of motion. Neck supple.  Cardiovascular: Normal rate, regular rhythm and normal heart sounds.  Pulmonary/Chest: Effort normal and breath sounds normal. No respiratory distress. He has no wheezes. He has  no rales. He exhibits no tenderness.  Abdominal: Soft. Bowel sounds are normal. He exhibits no distension and no mass. There is no tenderness.  Musculoskeletal: Normal range of motion. He exhibits edema. He exhibits no deformity.  Lymphadenopathy:    He has no cervical adenopathy.  Neurological: He is alert and oriented to person, place, and time. No cranial nerve deficit. Coordination normal.  Skin: Skin is warm and dry. No rash noted. No erythema.  Small area of bruising/echymosis,of left upper extremity   Psychiatric:  Anxiety.     LABORATORY DATA:  I have reviewed the data as listed Lab Results  Component Value Date   WBC 4.3 02/24/2018   HGB 10.3 (L) 02/24/2018   HCT 33.2 (L) 02/24/2018   MCV 69.8 (L) 02/24/2018   PLT 91 (L) 02/24/2018   Recent Labs    01/13/18 1609  NA 139  K 4.7  CL 102  CO2 28  GLUCOSE 117*  BUN 25*  CREATININE 0.73  CALCIUM 8.8*  GFRNONAA >60  GFRAA >60  PROT 8.5*  ALBUMIN 4.1  AST 18  ALT 8  ALKPHOS 50  BILITOT 1.2   Iron/TIBC/Ferritin/ %Sat    Component Value Date/Time   IRON 19 (L) 02/22/2018 1337   TIBC 282 02/22/2018 1337   FERRITIN 73 02/22/2018 1337   IRONPCTSAT 7 (L) 02/22/2018 1337    RADIOGRAPHIC STUDIES: I have personally reviewed the radiological images as listed and agreed with the findings in the report. 09/05/2016 2D echoL LVEF 55% 6/20/2017retroperitoneal [place]:  renal cysts, enlarged prostate.  07/29/2017 US abdomen: gallstone without acute cholecystitis, normal liver, spleen, pancrease. Renal cysts.  01/29/2018 CT chest abdomen pelvis.  1. No findings to explain the patient's symptoms. 2. Aortic atherosclerosis (ICD10-170.0). Coronary artery Calcification. 3.  Emphysema (ICD10-J43.9). 4. Cholelithiasis. 5. Probable simple or mildly complex renal cysts, difficult to definitively characterize without precontrast imaging. Lesions are previously evaluated by ultrasound on 07/29/2017.  Fibroscan demonstrated f2/f3  fibrosis  ASSESSMENT & PLAN:  1. Iron deficiency anemia, unspecified iron deficiency anemia type   2. Easy bruising   3. Microcytic anemia   4. Monoclonal B-cell lymphocytosis    # Labs are reviewed and discussed with patient.  Iron panel showed ferritin 73, saturation of 7%, TIBC 282. Hemoglobin improved to 10.3,  Patient is extremely concerned about additional IV iron as he he does not feel well after each Venofer infusion and is anxious about additional Venofer treatments. Will  hold additional IV iron at this point.  Iron panel has improved however still iron deficient.  Suggests patient to take oral Ferrous sulfate 351m BID.   Thrombocytopenia, platelet counts further decreased to 86, 000 Etiology unclear. Flowcytometry showed monoclonal lymphocytosis, [pre CLL], not explaining his thrombocytopenia.  I called Dr.Kernodle and discussed patient's case. Per Dr.Kernodle, although patient has Increased double-stranded DNA 75,  no lupus rash. Lupus diagnosis not confirmed. Before considering immuosuppressive therapy, need to rule out myeloproliferative/myelofibrosis process].   History of Hepatitis C, treated. Abnormal fibro scan, UK[place]owed unremarkable liver, no splenomegaly.  [facility] LStephens November  Patient does not have cirrhosis only mild fibrosis not explaining pancytopenia. For the work up of patient's pancytopenia, I will recommend checking bone marrow biopsy to rule out underlying bone marrow disorders.  I can see patient 2 weeks after bone marrow biopsy to go over results.  Orders Placed This Encounter  Procedures  . Hemoglobinopathy evaluation    Standing Status:   Future    Number of Occurrences:   1    Standing Expiration Date:   02/24/2019  . CBC with Differential/Platelet    Standing Status:   Future    Number of Occurrences:   1    Standing Expiration Date:   02/25/2019  . Vitamin B12    Standing Status:   Future     Number of Occurrences:   1    Standing Expiration Date:   03/27/2018    All questions were answered. The patient knows to call the clinic with any problems questions or concerns. Cc Dr.Bock Return of visit: 2 weeks after bone marrow biopsy. Total face to face encounter time for this patient visit was 25 min. >50% of the time was  spent in counseling and coordination of care.   ZEarlie Server MD, PhD Hematology Oncology [facility]at AEagleville HospitalPager- [phone]/21/2019

## 2018-02-24 NOTE — Telephone Encounter (Signed)
t called stating that Prescription was to be sent in for hime and it did not get sent. Please advise

## 2018-02-24 NOTE — Progress Notes (Signed)
Patient here for follow up. He states that after receiving iron infusion his feet and hands have been swollen and painful. No pain when walking, but when he tries to move feet its about an 8 or 9

## 2018-02-24 NOTE — Telephone Encounter (Signed)
Patient called and states this prescription is too expensive and he needs something else called in

## 2018-02-24 NOTE — Telephone Encounter (Signed)
I spoke with the patient, patient asked if there was cheaper alternative, per our conversation the pharmacist wasn't very helpful.  Searched Internet found a brand for cheaper, gave him the information, patient was happy with the information I gave him, he said he'd call me back if he had any other questions.

## 2018-02-24 NOTE — Telephone Encounter (Signed)
Ferrous sulfate 325 BID, just sent to [facility] pharmacy.

## 2018-02-25 LAB — VITAMIN B12: VITAMIN B 12: 442 pg/mL (ref 180–914)

## 2018-02-26 LAB — HEMOGLOBINOPATHY EVALUATION
HGB C: 0 %
HGB S QUANTITAION: 0 %
HGB VARIANT: 0 %
Hgb A2 Quant: 2.3 % (ref 1.8–3.2)
Hgb A: 97.7 % (ref 96.4–98.8)
Hgb F Quant: 0 % (ref 0.0–2.0)

## 2018-03-12 ENCOUNTER — Other Ambulatory Visit: Payer: Self-pay | Admitting: Oncology

## 2018-03-12 DIAGNOSIS — R233 Spontaneous ecchymoses: Secondary | ICD-10-CM

## 2018-03-12 DIAGNOSIS — D509 Iron deficiency anemia, unspecified: Secondary | ICD-10-CM

## 2018-03-12 DIAGNOSIS — R238 Other skin changes: Secondary | ICD-10-CM

## 2018-03-12 DIAGNOSIS — D61818 Other pancytopenia: Secondary | ICD-10-CM

## 2018-03-15 ENCOUNTER — Other Ambulatory Visit: Payer: Self-pay | Admitting: Gastroenterology

## 2018-03-15 DIAGNOSIS — N2889 Other specified disorders of kidney and ureter: Secondary | ICD-10-CM

## 2018-03-22 ENCOUNTER — Encounter
Admission: RE | Disposition: A | Discharge status: Home / Self Care | Payer: Self-pay | Source: Ambulatory Visit | Attending: Gastroenterology

## 2018-03-22 ENCOUNTER — Ambulatory Visit
Admission: RE | Admit: 2018-03-22 | Discharge: 2018-03-22 | Disposition: A | Discharge status: Home / Self Care | Payer: Medicare HMO | Source: Ambulatory Visit | Attending: Gastroenterology | Admitting: Gastroenterology

## 2018-03-22 ENCOUNTER — Ambulatory Visit: Payer: Medicare HMO | Admitting: Certified Registered Nurse Anesthetist

## 2018-03-22 ENCOUNTER — Encounter: Payer: Self-pay | Admitting: Anesthesiology

## 2018-03-22 DIAGNOSIS — Z79899 Other long term (current) drug therapy: Secondary | ICD-10-CM | POA: Insufficient documentation

## 2018-03-22 DIAGNOSIS — D509 Iron deficiency anemia, unspecified: Secondary | ICD-10-CM | POA: Diagnosis present

## 2018-03-22 DIAGNOSIS — D72820 Lymphocytosis (symptomatic): Secondary | ICD-10-CM | POA: Insufficient documentation

## 2018-03-22 DIAGNOSIS — M3500 Sicca syndrome, unspecified: Secondary | ICD-10-CM | POA: Diagnosis not present

## 2018-03-22 DIAGNOSIS — K909 Intestinal malabsorption, unspecified: Secondary | ICD-10-CM | POA: Insufficient documentation

## 2018-03-22 DIAGNOSIS — K449 Diaphragmatic hernia without obstruction or gangrene: Secondary | ICD-10-CM | POA: Insufficient documentation

## 2018-03-22 DIAGNOSIS — I10 Essential (primary) hypertension: Secondary | ICD-10-CM | POA: Insufficient documentation

## 2018-03-22 DIAGNOSIS — R634 Abnormal weight loss: Secondary | ICD-10-CM | POA: Diagnosis not present

## 2018-03-22 DIAGNOSIS — K2900 Acute gastritis without bleeding: Secondary | ICD-10-CM | POA: Insufficient documentation

## 2018-03-22 DIAGNOSIS — B1920 Unspecified viral hepatitis C without hepatic coma: Secondary | ICD-10-CM | POA: Diagnosis not present

## 2018-03-22 DIAGNOSIS — D61818 Other pancytopenia: Secondary | ICD-10-CM | POA: Diagnosis not present

## 2018-03-22 DIAGNOSIS — K3189 Other diseases of stomach and duodenum: Secondary | ICD-10-CM | POA: Diagnosis not present

## 2018-03-22 DIAGNOSIS — M329 Systemic lupus erythematosus, unspecified: Secondary | ICD-10-CM | POA: Diagnosis not present

## 2018-03-22 DIAGNOSIS — M1990 Unspecified osteoarthritis, unspecified site: Secondary | ICD-10-CM | POA: Diagnosis not present

## 2018-03-22 DIAGNOSIS — E7800 Pure hypercholesterolemia, unspecified: Secondary | ICD-10-CM | POA: Insufficient documentation

## 2018-03-22 DIAGNOSIS — K2210 Ulcer of esophagus without bleeding: Secondary | ICD-10-CM | POA: Diagnosis not present

## 2018-03-22 HISTORY — PX: ESOPHAGOGASTRODUODENOSCOPY (EGD) WITH PROPOFOL: SHX5813

## 2018-03-22 LAB — CBC WITH DIFFERENTIAL/PLATELET
BASOS PCT: 1 %
Basophils Absolute: 0 10*3/uL (ref 0–0.1)
EOS ABS: 0.1 10*3/uL (ref 0–0.7)
Eosinophils Relative: 2 %
HCT: 37.4 % — ABNORMAL LOW (ref 40.0–52.0)
Hemoglobin: 12 g/dL — ABNORMAL LOW (ref 13.0–18.0)
LYMPHS ABS: 1 10*3/uL (ref 1.0–3.6)
Lymphocytes Relative: 19 %
MCH: 22.9 pg — AB (ref 26.0–34.0)
MCHC: 32.1 g/dL (ref 32.0–36.0)
MCV: 71.6 fL — ABNORMAL LOW (ref 80.0–100.0)
Monocytes Absolute: 0.4 10*3/uL (ref 0.2–1.0)
Monocytes Relative: 7 %
Neutro Abs: 3.8 10*3/uL (ref 1.4–6.5)
Neutrophils Relative %: 71 %
PLATELETS: 77 10*3/uL — AB (ref 150–440)
RBC: 5.22 MIL/uL (ref 4.40–5.90)
RDW: 20.6 % — ABNORMAL HIGH (ref 11.5–14.5)
WBC: 5.3 10*3/uL (ref 3.8–10.6)

## 2018-03-22 LAB — PROTIME-INR
INR: 1.04
PROTHROMBIN TIME: 13.5 s (ref 11.4–15.2)

## 2018-03-22 SURGERY — ESOPHAGOGASTRODUODENOSCOPY (EGD) WITH PROPOFOL
Anesthesia: General

## 2018-03-22 MED ORDER — SODIUM CHLORIDE 0.9 % IV SOLN
INTRAVENOUS | Status: DC
  Administered 2018-03-22: 1000 mL via INTRAVENOUS

## 2018-03-22 MED ORDER — GLYCOPYRROLATE 0.2 MG/ML IJ SOLN
INTRAMUSCULAR | Status: AC
  Filled 2018-03-22: qty 1

## 2018-03-22 MED ORDER — FENTANYL CITRATE (PF) 100 MCG/2ML IJ SOLN
INTRAMUSCULAR | Status: AC
  Filled 2018-03-22: qty 2

## 2018-03-22 MED ORDER — LIDOCAINE HCL (CARDIAC) PF 100 MG/5ML IV SOSY
PREFILLED_SYRINGE | INTRAVENOUS | Status: DC | PRN
  Administered 2018-03-22: 100 mg via INTRAVENOUS

## 2018-03-22 MED ORDER — GLYCOPYRROLATE 0.2 MG/ML IJ SOLN
INTRAMUSCULAR | Status: DC | PRN
  Administered 2018-03-22: 0.2 mg via INTRAVENOUS

## 2018-03-22 MED ORDER — LIDOCAINE HCL (PF) 1 % IJ SOLN
INTRAMUSCULAR | Status: AC
  Administered 2018-03-22: 0.3 mL via INTRADERMAL
  Filled 2018-03-22: qty 2

## 2018-03-22 MED ORDER — LIDOCAINE HCL (PF) 1 % IJ SOLN
2.0000 mL | Freq: Once | INTRAMUSCULAR | Status: AC
  Administered 2018-03-22: 0.3 mL via INTRADERMAL

## 2018-03-22 MED ORDER — FENTANYL CITRATE (PF) 100 MCG/2ML IJ SOLN
INTRAMUSCULAR | Status: DC | PRN
  Administered 2018-03-22: 100 ug via INTRAVENOUS

## 2018-03-22 MED ORDER — LIDOCAINE HCL (PF) 2 % IJ SOLN
INTRAMUSCULAR | Status: AC
  Filled 2018-03-22: qty 10

## 2018-03-22 MED ORDER — PROPOFOL 500 MG/50ML IV EMUL
INTRAVENOUS | Status: AC
  Filled 2018-03-22: qty 50

## 2018-03-22 MED ORDER — PROPOFOL 10 MG/ML IV BOLUS
INTRAVENOUS | Status: DC | PRN
  Administered 2018-03-22: 40 mg via INTRAVENOUS

## 2018-03-22 MED ORDER — PROPOFOL 500 MG/50ML IV EMUL
INTRAVENOUS | Status: DC | PRN
  Administered 2018-03-22: 100 ug/kg/min via INTRAVENOUS

## 2018-03-22 NOTE — Anesthesia Post-op Follow-up Note (Signed)
Anesthesia QCDR form completed.        

## 2018-03-22 NOTE — Anesthesia Procedure Notes (Signed)
Performed by: Demetrius Charity, CRNA Pre-anesthesia Checklist: Patient identified, Emergency Drugs available, Patient being monitored, Suction available and Timeout performed Patient Re-evaluated:Patient Re-evaluated prior to induction Oxygen Delivery Method: Nasal cannula Induction Type: IV induction

## 2018-03-22 NOTE — Op Note (Signed)
[facility] Gastroenterology Patient Name: Juan Nguyen Procedure Date: 03/22/2018 9:17 AM MRN: [id number] Account #: [id number] Date of Birth: [birth year] Admit Type: Outpatient Age: 72 Room: [facility] ENDO ROOM 1 Gender: Male Note Status: Finalized Procedure:            Upper GI endoscopy Indications:          Iron deficiency anemia, Weight loss Providers:            Lollie Sails, MD Referring MD:         Ocie Cornfield. Anderson MD, MD (Referring MD) Medicines:            Monitored Anesthesia Care Complications:        Due to continuous eructation, it was diffifult to                        maintain insufflation of the gastric body for retroflex                        evaluation. Procedure:            Pre-Anesthesia Assessment:                       - ASA Grade Assessment: III - A patient with severe                        systemic disease.                       After obtaining informed consent, the endoscope was                        passed under direct vision. Throughout the procedure,                        the patient's blood pressure, pulse, and oxygen                        saturations were monitored continuously. The Endoscope                        was introduced through the mouth, and advanced to the                        fourth part of duodenum. The upper GI endoscopy was                        accomplished without difficulty. The patient tolerated                        the procedure well. Findings:      LA Grade C (one or more mucosal breaks continuous between tops of 2 or       more mucosal folds, less than 75% circumference) esophagitis with no       bleeding was found. Biopsies were taken with a cold forceps for       histology.      A small hiatal hernia was found. The Z-line was a variable distance from       incisors; the hiatal hernia was sliding.      The exam of the esophagus was otherwise normal.  Diffuse and patchy mild  inflammation characterized by adherent blood,       congestion (edema) and erythema was found in the gastric body and in the       prepyloric region of the stomach. Biopsies were taken with a cold       forceps for histology. Biopsies were taken with a cold forceps for       Helicobacter pylori testing.      Diffuse and patchy moderate mucosal variance characterized by       flattening, nodularity and altered texture was found in the entire       duodenum. Biopsies for histology were taken with a cold forceps for       evaluation of celiac disease. Biopsies were taken with a cold forceps       for histology. Impression:           - LA Grade C erosive esophagitis. Biopsied.                       - Small hiatal hernia.                       - Erosive gastritis. Biopsied.                       - Mucosal variant in the duodenum. Biopsied. Recommendation:       - Use Protonix (pantoprazole) 40 mg PO daily daily.                       - Check Celiac serology panel today.                       - Return to GI clinic in 3 weeks. Procedure Code(s):    --- Professional ---                       [phone], Esophagogastroduodenoscopy, flexible, transoral;                        with biopsy, single or multiple Diagnosis Code(s):    --- Professional ---                       K20.8, Other esophagitis                       K44.9, Diaphragmatic hernia without obstruction or                        gangrene                       K29.60, Other gastritis without bleeding                       K31.89, Other diseases of stomach and duodenum                       D50.9, Iron deficiency anemia, unspecified                       R63.4, Abnormal weight loss CPT copyright 2017 American Medical Association. All rights reserved. The codes documented in this report are preliminary and upon coder review may  be revised to meet current  compliance requirements. Lollie Sails, MD 03/22/2018 9:59:58 AM This report has  been signed electronically. Number of Addenda: 0 Note Initiated On: 03/22/2018 9:17 AM      [facility]

## 2018-03-22 NOTE — Anesthesia Postprocedure Evaluation (Signed)
Anesthesia Post Note  Patient: Juan Nguyen  Procedure(s) Performed: ESOPHAGOGASTRODUODENOSCOPY (EGD) WITH PROPOFOL (N/A )  Patient location during evaluation: Endoscopy Anesthesia Type: General Level of consciousness: awake and alert Pain management: pain level controlled Vital Signs Assessment: post-procedure vital signs reviewed and stable Respiratory status: spontaneous breathing, nonlabored ventilation, respiratory function stable and patient connected to nasal cannula oxygen Cardiovascular status: blood pressure returned to baseline and stable Postop Assessment: no apparent nausea or vomiting Anesthetic complications: no     Last Vitals:  Vitals:   03/22/18 0959 03/22/18 1028  BP: 121/82 120/70  Pulse:    Resp: 18   Temp:    SpO2:      Last Pain:  Vitals:   03/22/18 0818  TempSrc: Tympanic                 [name] S

## 2018-03-22 NOTE — Anesthesia Preprocedure Evaluation (Addendum)
Anesthesia Evaluation  Patient identified by MRN, date of birth, ID band Patient awake    Reviewed: Allergy & Precautions, NPO status , Patient's Chart, lab work & pertinent test results, reviewed documented beta blocker date and time   Airway Mallampati: II  TM Distance: >3 FB     Dental  (+) Upper Dentures, Lower Dentures   Pulmonary shortness of breath, Current Smoker,           Cardiovascular hypertension, Pt. on medications      Neuro/Psych    GI/Hepatic (+) Hepatitis -, C  Endo/Other    Renal/GU      Musculoskeletal  (+) Arthritis ,   Abdominal   Peds  Hematology  (+) anemia , JEHOVAH'S WITNESS  Anesthesia Other Findings Pt denies Lupus or Sjogrens. EKG shows chronic PACs.  Reproductive/Obstetrics                            Anesthesia Physical Anesthesia Plan  ASA: III  Anesthesia Plan: General   Post-op Pain Management:    Induction: Intravenous  PONV Risk Score and Plan:   Airway Management Planned:   Additional Equipment:   Intra-op Plan:   Post-operative Plan:   Informed Consent: I have reviewed the patients History and Physical, chart, labs and discussed the procedure including the risks, benefits and alternatives for the proposed anesthesia with the patient or authorized representative who has indicated his/her understanding and acceptance.     Plan Discussed with: CRNA  Anesthesia Plan Comments:         Anesthesia Quick Evaluation

## 2018-03-22 NOTE — Transfer of Care (Signed)
Immediate Anesthesia Transfer of Care Note  Patient: Juan Nguyen  Procedure(s) Performed: ESOPHAGOGASTRODUODENOSCOPY (EGD) WITH PROPOFOL (N/A )  Patient Location: PACU  Anesthesia Type:General  Level of Consciousness: awake, alert  and oriented  Airway & Oxygen Therapy: Patient Spontanous Breathing  Post-op Assessment: Report given to RN and Post -op Vital signs reviewed and stable  Post vital signs: Reviewed and stable  Last Vitals:  Vitals Value Taken Time  BP 121/82 03/22/2018  9:59 AM  Temp 36.1 C 03/22/2018  9:50 AM  Pulse 73 03/22/2018 10:00 AM  Resp 11 03/22/2018 10:00 AM  SpO2 98 % 03/22/2018 10:00 AM  Vitals shown include unvalidated device data.  Last Pain:  Vitals:   03/22/18 0818  TempSrc: Tympanic         Complications: No apparent anesthesia complications

## 2018-03-22 NOTE — H&P (Signed)
Outpatient short stay form Pre-procedure 03/22/2018 7:29 AM Lollie Sails MD  Primary Physician: Fulton Reek, MD  Reason for visit: EGD  History of present illness: Patient is a 72 year old male presenting today as above.  He has a personal history of iron deficiency anemia weight loss 20 pounds unintentionally over the.  Past year.  There is also some turn due to his history of pancytopenia.  He has been treated for chronic hepatitis C and is negative at this time.  Labs this morning indicated total white count of 5.3 neutrophils of 3.8 hemoglobin hematocrit of 12.0/37.4 respectively and a INR of 1.04.  Of note patient is on oral iron and does have an appointment to see hematology.  Note patient states he has a good appetite he denies any nausea vomiting abdominal pain dysphagia or heartburn issues.  He denies any rectal bleeding or diarrhea.  Patient did have a colonoscopy a little over a year ago 2 small polyps were removed and there was some diverticulosis.  1 of the polyps was a tubular adenoma.  No current facility-administered medications for this encounter.   Current Outpatient Medications:  .  amLODipine (NORVASC) 2.5 MG tablet, Take 2.5 mg by mouth daily., Disp: , Rfl:  .  diazepam (VALIUM) 5 MG tablet, Take 5 mg by mouth 2 (two) times daily., Disp: , Rfl:  .  ferrous sulfate 325 (65 FE) MG EC tablet, Take 1 tablet (325 mg total) by mouth 2 (two) times daily., Disp: 30 tablet, Rfl: 3 .  lisinopril (PRINIVIL,ZESTRIL) 20 MG tablet, Take 20 mg by mouth daily., Disp: , Rfl:  .  sildenafil (VIAGRA) 100 MG tablet, Take 100 mg by mouth daily as needed for erectile dysfunction., Disp: , Rfl:   No medications prior to admission.     Allergies  Allergen Reactions  . Eggs Or Egg-Derived Products Nausea Only  . Penicillins Other (See Comments)    Unknown Has patient had a PCN reaction causing immediate rash, facial/tongue/throat swelling, SOB or lightheadedness with hypotension:  Unknown Has patient had a PCN reaction causing severe rash involving mucus membranes or skin necrosis: Unknown Has patient had a PCN reaction that required hospitalization: Unknown Has patient had a PCN reaction occurring within the last 10 years: Unknown If all of the above answers are "NO", then may proceed with Cephalosporin use.   Marland Kitchen Shellfish-Derived Products Hives     Past Medical History:  Diagnosis Date  . Arthritis    RHEUMATOID  . Dyspnea   . Genital herpes   . Hepatitis    HEP.C  . Hepatitis C   . Hypercholesteremia   . Hypertension   . Lupus ([place])   . Pancytopenia ([place])   . Sjogren's syndrome ([place])     Review of systems:      Physical Exam    Heart and lungs: Regular rate and rhythm without rub or gallop, lungs are bilaterally clear.    HEENT: Normocephalic atraumatic eyes are anicteric    Other:    Pertinant exam for procedure: Soft nontender nondistended bowel sounds positive normoactive.    Planned proceedures: EGD and indicated procedures. I have discussed the risks benefits and complications of procedures to include not limited to bleeding, infection, perforation and the risk of sedation and the patient wishes to proceed.    Lollie Sails, MD Gastroenterology 03/22/2018  7:29 AM

## 2018-03-23 ENCOUNTER — Other Ambulatory Visit: Payer: Self-pay | Admitting: Student

## 2018-03-23 ENCOUNTER — Other Ambulatory Visit: Payer: Self-pay | Admitting: Radiology

## 2018-03-23 ENCOUNTER — Encounter: Payer: Self-pay | Admitting: Gastroenterology

## 2018-03-24 ENCOUNTER — Other Ambulatory Visit (HOSPITAL_COMMUNITY)
Admission: RE | Admit: 2018-03-24 | Disposition: A | Discharge status: Home / Self Care | Payer: Medicare HMO | Source: Ambulatory Visit | Attending: Oncology | Admitting: Oncology

## 2018-03-24 ENCOUNTER — Ambulatory Visit
Admission: RE | Admit: 2018-03-24 | Discharge: 2018-03-24 | Disposition: A | Discharge status: Home / Self Care | Payer: Medicare HMO | Source: Ambulatory Visit | Attending: Oncology | Admitting: Oncology

## 2018-03-24 DIAGNOSIS — Z87891 Personal history of nicotine dependence: Secondary | ICD-10-CM | POA: Diagnosis not present

## 2018-03-24 DIAGNOSIS — E7800 Pure hypercholesterolemia, unspecified: Secondary | ICD-10-CM | POA: Diagnosis not present

## 2018-03-24 DIAGNOSIS — Z9889 Other specified postprocedural states: Secondary | ICD-10-CM | POA: Diagnosis not present

## 2018-03-24 DIAGNOSIS — D509 Iron deficiency anemia, unspecified: Secondary | ICD-10-CM | POA: Diagnosis not present

## 2018-03-24 DIAGNOSIS — M069 Rheumatoid arthritis, unspecified: Secondary | ICD-10-CM | POA: Insufficient documentation

## 2018-03-24 DIAGNOSIS — M329 Systemic lupus erythematosus, unspecified: Secondary | ICD-10-CM | POA: Insufficient documentation

## 2018-03-24 DIAGNOSIS — Z8619 Personal history of other infectious and parasitic diseases: Secondary | ICD-10-CM | POA: Insufficient documentation

## 2018-03-24 DIAGNOSIS — R64 Cachexia: Secondary | ICD-10-CM | POA: Insufficient documentation

## 2018-03-24 DIAGNOSIS — D479 Neoplasm of uncertain behavior of lymphoid, hematopoietic and related tissue, unspecified: Secondary | ICD-10-CM | POA: Diagnosis not present

## 2018-03-24 DIAGNOSIS — Z880 Allergy status to penicillin: Secondary | ICD-10-CM | POA: Diagnosis not present

## 2018-03-24 DIAGNOSIS — M3500 Sicca syndrome, unspecified: Secondary | ICD-10-CM | POA: Diagnosis not present

## 2018-03-24 DIAGNOSIS — R238 Other skin changes: Secondary | ICD-10-CM | POA: Insufficient documentation

## 2018-03-24 DIAGNOSIS — Z79899 Other long term (current) drug therapy: Secondary | ICD-10-CM | POA: Insufficient documentation

## 2018-03-24 DIAGNOSIS — D696 Thrombocytopenia, unspecified: Secondary | ICD-10-CM | POA: Insufficient documentation

## 2018-03-24 DIAGNOSIS — I10 Essential (primary) hypertension: Secondary | ICD-10-CM | POA: Diagnosis not present

## 2018-03-24 DIAGNOSIS — Z7952 Long term (current) use of systemic steroids: Secondary | ICD-10-CM | POA: Insufficient documentation

## 2018-03-24 DIAGNOSIS — D61818 Other pancytopenia: Secondary | ICD-10-CM

## 2018-03-24 DIAGNOSIS — D72820 Lymphocytosis (symptomatic): Secondary | ICD-10-CM | POA: Insufficient documentation

## 2018-03-24 DIAGNOSIS — Z91013 Allergy to seafood: Secondary | ICD-10-CM | POA: Diagnosis not present

## 2018-03-24 DIAGNOSIS — Z91012 Allergy to eggs: Secondary | ICD-10-CM | POA: Insufficient documentation

## 2018-03-24 DIAGNOSIS — R233 Spontaneous ecchymoses: Secondary | ICD-10-CM

## 2018-03-24 LAB — CBC WITH DIFFERENTIAL/PLATELET
Basophils Absolute: 0.1 10*3/uL (ref 0–0.1)
Basophils Relative: 1 %
EOS PCT: 2 %
Eosinophils Absolute: 0.1 10*3/uL (ref 0–0.7)
HEMATOCRIT: 34 % — AB (ref 40.0–52.0)
Hemoglobin: 11.1 g/dL — ABNORMAL LOW (ref 13.0–18.0)
Lymphocytes Relative: 20 %
Lymphs Abs: 1 10*3/uL (ref 1.0–3.6)
MCH: 22.8 pg — ABNORMAL LOW (ref 26.0–34.0)
MCHC: 32.6 g/dL (ref 32.0–36.0)
MCV: 69.8 fL — AB (ref 80.0–100.0)
MONO ABS: 0.4 10*3/uL (ref 0.2–1.0)
MONOS PCT: 7 %
NEUTROS PCT: 70 %
Neutro Abs: 3.4 10*3/uL (ref 1.4–6.5)
PLATELETS: 72 10*3/uL — AB (ref 150–440)
RBC: 4.87 MIL/uL (ref 4.40–5.90)
RDW: 20.3 % — ABNORMAL HIGH (ref 11.5–14.5)
WBC: 5 10*3/uL (ref 3.8–10.6)

## 2018-03-24 LAB — CELIAC DISEASE PANEL
Endomysial Ab, IgA: NEGATIVE
IgA: 249 mg/dL (ref 61–437)
Tissue Transglutaminase Ab, IgA: 7 U/mL — ABNORMAL HIGH (ref 0–3)

## 2018-03-24 MED ORDER — FENTANYL CITRATE (PF) 100 MCG/2ML IJ SOLN
INTRAMUSCULAR | Status: AC | PRN
  Administered 2018-03-24: 25 ug via INTRAVENOUS
  Administered 2018-03-24: 50 ug via INTRAVENOUS

## 2018-03-24 MED ORDER — LIDOCAINE HCL (PF) 1 % IJ SOLN
INTRAMUSCULAR | Status: AC | PRN
  Administered 2018-03-24: 10 mL

## 2018-03-24 MED ORDER — SODIUM CHLORIDE 0.9 % IV SOLN
INTRAVENOUS | Status: DC
  Administered 2018-03-24: 08:00:00 via INTRAVENOUS

## 2018-03-24 MED ORDER — FENTANYL CITRATE (PF) 100 MCG/2ML IJ SOLN
INTRAMUSCULAR | Status: AC
  Filled 2018-03-24: qty 4

## 2018-03-24 MED ORDER — HEPARIN SOD (PORK) LOCK FLUSH 100 UNIT/ML IV SOLN
INTRAVENOUS | Status: AC
  Filled 2018-03-24: qty 5

## 2018-03-24 MED ORDER — MIDAZOLAM HCL 5 MG/5ML IJ SOLN
INTRAMUSCULAR | Status: AC
  Filled 2018-03-24: qty 5

## 2018-03-24 MED ORDER — MIDAZOLAM HCL 5 MG/5ML IJ SOLN
INTRAMUSCULAR | Status: AC | PRN
  Administered 2018-03-24: 0.5 mg via INTRAVENOUS
  Administered 2018-03-24 (×2): 1 mg via INTRAVENOUS

## 2018-03-24 NOTE — Procedures (Signed)
Pre-procedure Diagnosis: Pancytopenia Post-procedure Diagnosis: Same  Technically successful CT guided bone marrow aspiration and biopsy of left iliac crest.   Complications: None Immediate  EBL: None  SignedSandi Mariscal Pager: [phone] 03/24/2018, 9:22 AM

## 2018-03-24 NOTE — Consult Note (Signed)
Chief Complaint: Pancytopenia  Referring Physician(s): Yu,Zhou  Patient Status: [facility] - Out-pt  History of Present Illness: Juan Nguyen is a 72 y.o. male with past mental history significant for rheumatoid arthritis, hepatitis C, hyperlipidemia, hypertension, Sjogren's syndrome, lupus and pancytopenia who presents today for CT-guided bone marrow biopsy and aspiration.  Patient is accompanied by his wife though serves as his own historian.  Patient states his baseline health is unchanged.  Specifically, no chest pain, shortness of breath, fever or chills.  Past Medical History:  Diagnosis Date  . Arthritis    RHEUMATOID  . Dyspnea   . Genital herpes   . Hepatitis    HEP.C  . Hepatitis C   . Hypercholesteremia   . Hypertension   . Lupus ([place])   . Pancytopenia ([place])   . Sjogren's syndrome [facility])     Past Surgical History:  Procedure Laterality Date  . COLONOSCOPY    . COLONOSCOPY WITH PROPOFOL N/A 07/14/2017   Procedure: COLONOSCOPY WITH PROPOFOL;  Surgeon: Lollie Sails, MD;  Location: [facility] ENDOSCOPY;  Service: Endoscopy;  Laterality: N/A;  . ESOPHAGOGASTRODUODENOSCOPY (EGD) WITH PROPOFOL N/A 03/22/2018   Procedure: ESOPHAGOGASTRODUODENOSCOPY (EGD) WITH PROPOFOL;  Surgeon: Lollie Sails, MD;  Location: [facility] ENDOSCOPY;  Service: Endoscopy;  Laterality: N/A;  . HERNIA REPAIR Left   . INGUINAL HERNIA REPAIR Right 08/04/2017   Procedure: HERNIA REPAIR INGUINAL ADULT;  Surgeon: Leonie Green, MD;  Location: [facility] ORS;  Service: General;  Laterality: Right;  . KNEE ARTHROSCOPY      Allergies: Eggs or egg-derived products; Penicillins; and Shellfish-derived products  Medications: Prior to Admission medications   Medication Sig Start Date End Date Taking? Authorizing Provider  amLODipine (NORVASC) 2.5 MG tablet Take 2.5 mg by mouth daily.   Yes [provider]  ferrous sulfate 325 (65 FE) MG EC tablet Take 1 tablet (325 mg total) by mouth 2 (two)  times daily. 02/24/18  Yes Earlie Server, MD  lisinopril (PRINIVIL,ZESTRIL) 20 MG tablet Take 20 mg by mouth daily.   Yes [provider]  predniSONE (DELTASONE) 10 MG tablet Take 10 mg by mouth daily with breakfast.   Yes [provider]  diazepam (VALIUM) 5 MG tablet Take 5 mg by mouth 2 (two) times daily.    [provider]  sildenafil (VIAGRA) 100 MG tablet Take 100 mg by mouth daily as needed for erectile dysfunction.    [provider]     History reviewed. No pertinent family history.  Social History   Socioeconomic History  . Marital status: Married    Spouse name: Not on file  . Number of children: Not on file  . Years of education: Not on file  . Highest education level: Not on file  Occupational History  . Not on file  Social Needs  . Financial resource strain: Not on file  . Food insecurity:    Worry: Patient refused    Inability: Patient refused  . Transportation needs:    Medical: Patient refused    Non-medical: Patient refused  Tobacco Use  . Smoking status: Former Smoker    Packs/day: 0.50    Last attempt to quit: 03/22/2018  . Smokeless tobacco: Never Used  Substance and Sexual Activity  . Alcohol use: No    Frequency: Never  . 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 Sexual activity: Not on file  Lifestyle  . Physical activity:    Days  per week: Patient refused    Minutes per session: Patient refused  . Stress: Not on file  Relationships  . Social connections:    Talks on phone: Not on file    Gets together: Not on file    Attends religious service: Not on file    Active member of club or organization: Not on file    Attends meetings of clubs or organizations: Not on file    Relationship status: Not on file  Other Topics Concern  . Not on file  Social History Narrative  . Not on file    ECOG Status: 1 - Symptomatic but completely ambulatory  Review of Systems: A 12 point ROS discussed and  pertinent positives are indicated in the HPI above.  All other systems are negative.  Review of Systems  Constitutional: Negative for activity change, appetite change, fatigue and fever.  Respiratory: Negative.   Cardiovascular: Negative.   Skin:       Bruising of the bilateral upper extremities.    Vital Signs: BP 138/87   Pulse 72   Temp 98.6 F (37 C) (Oral)   Resp 18   SpO2 95%   Physical Exam  Constitutional:  Patient appears cachectic.  Cardiovascular: Normal rate and regular rhythm.  Pulmonary/Chest: Effort normal and breath sounds normal.  Skin:  Bruising about the bilateral upper extremities.  Psychiatric: He has a normal mood and affect. His behavior is normal.  Nursing note and vitals reviewed.   Imaging: No results found.  Labs:  CBC: Recent Labs    02/22/18 1337 02/24/18 1449 03/22/18 0809 03/24/18 0704  WBC 5.0 4.3 5.3 5.0  HGB 10.3* 10.3* 12.0* 11.1*  HCT 32.4* 33.2* 37.4* 34.0*  PLT 86* 91* 77* 72*    COAGS: Recent Labs    01/19/18 1300 03/22/18 0809  INR 1.11 1.04  APTT 31  --     BMP: Recent Labs    01/13/18 1609  NA 139  K 4.7  CL 102  CO2 28  GLUCOSE 117*  BUN 25*  CALCIUM 8.8*  CREATININE 0.73  GFRNONAA >60  GFRAA >60    LIVER FUNCTION TESTS: Recent Labs    01/13/18 1609  BILITOT 1.2  AST 18  ALT 8  ALKPHOS 50  PROT 8.5*  ALBUMIN 4.1    TUMOR MARKERS: No results for input(s): AFPTM, CEA, CA199, CHROMGRNA in the last 8760 hours.  Assessment and Plan:  OAK DOREY is a 72 y.o. male with past mental history significant for rheumatoid arthritis, hepatitis C, hyperlipidemia, hypertension, Sjogren's syndrome, lupus and pancytopenia who presents today for CT-guided bone marrow biopsy and aspiration.    Patient states his baseline health is unchanged.  Specifically, no chest pain, shortness of breath, fever or chills.  Risks and benefits of CT-guided bone marrow biopsy and aspiration discussed with the  patient including, but not limited to bleeding, infection, damage to adjacent structures or low yield requiring additional tests.  All of the patient's questions were answered, patient is agreeable to proceed.  Consent signed and in chart.  Thank you for this interesting consult.  I greatly enjoyed meeting Juvon A Bole and look forward to participating in their care.  A copy of this report was sent to the requesting provider on this date.  Electronically Signed: Sandi Mariscal, MD 03/24/2018, 8:26 AM   I spent a total of 15 Minutes in face to face in clinical consultation, greater than 50% of which was counseling/coordinating care for CT guided  BM Bx

## 2018-03-25 LAB — SURGICAL PATHOLOGY

## 2018-03-31 ENCOUNTER — Ambulatory Visit
Admission: RE | Admit: 2018-03-31 | Discharge: 2018-03-31 | Disposition: A | Discharge status: Home / Self Care | Payer: Medicare HMO | Source: Ambulatory Visit | Attending: Gastroenterology | Admitting: Gastroenterology

## 2018-03-31 DIAGNOSIS — R935 Abnormal findings on diagnostic imaging of other abdominal regions, including retroperitoneum: Secondary | ICD-10-CM | POA: Diagnosis not present

## 2018-03-31 DIAGNOSIS — N281 Cyst of kidney, acquired: Secondary | ICD-10-CM | POA: Diagnosis not present

## 2018-03-31 DIAGNOSIS — N2889 Other specified disorders of kidney and ureter: Secondary | ICD-10-CM

## 2018-03-31 LAB — POCT I-STAT CREATININE: CREATININE: 0.7 mg/dL (ref 0.61–1.24)

## 2018-03-31 MED ORDER — GADOBENATE DIMEGLUMINE 529 MG/ML IV SOLN
10.0000 mL | Freq: Once | INTRAVENOUS | Status: AC | PRN
  Administered 2018-03-31: 10 mL via INTRAVENOUS

## 2018-04-06 ENCOUNTER — Encounter (HOSPITAL_COMMUNITY): Payer: Self-pay | Admitting: Oncology

## 2018-04-06 ENCOUNTER — Ambulatory Visit: Payer: Self-pay | Admitting: Urology

## 2018-04-07 ENCOUNTER — Other Ambulatory Visit: Payer: Self-pay

## 2018-04-07 ENCOUNTER — Inpatient Hospital Stay: Payer: Medicare HMO | Attending: Oncology | Admitting: Oncology

## 2018-04-07 ENCOUNTER — Encounter: Payer: Self-pay | Admitting: Oncology

## 2018-04-07 VITALS — BP 123/76 | HR 77 | Temp 98.8°F | Resp 18 | Wt 129.4 lb

## 2018-04-07 DIAGNOSIS — Z87891 Personal history of nicotine dependence: Secondary | ICD-10-CM | POA: Diagnosis not present

## 2018-04-07 DIAGNOSIS — B182 Chronic viral hepatitis C: Secondary | ICD-10-CM | POA: Diagnosis not present

## 2018-04-07 DIAGNOSIS — R238 Other skin changes: Secondary | ICD-10-CM | POA: Insufficient documentation

## 2018-04-07 DIAGNOSIS — D696 Thrombocytopenia, unspecified: Secondary | ICD-10-CM | POA: Diagnosis not present

## 2018-04-07 DIAGNOSIS — M1990 Unspecified osteoarthritis, unspecified site: Secondary | ICD-10-CM | POA: Insufficient documentation

## 2018-04-07 DIAGNOSIS — R233 Spontaneous ecchymoses: Secondary | ICD-10-CM

## 2018-04-07 DIAGNOSIS — D509 Iron deficiency anemia, unspecified: Secondary | ICD-10-CM | POA: Diagnosis not present

## 2018-04-07 DIAGNOSIS — E7800 Pure hypercholesterolemia, unspecified: Secondary | ICD-10-CM | POA: Insufficient documentation

## 2018-04-07 DIAGNOSIS — M3500 Sicca syndrome, unspecified: Secondary | ICD-10-CM | POA: Insufficient documentation

## 2018-04-07 DIAGNOSIS — I10 Essential (primary) hypertension: Secondary | ICD-10-CM | POA: Diagnosis not present

## 2018-04-07 DIAGNOSIS — M329 Systemic lupus erythematosus, unspecified: Secondary | ICD-10-CM | POA: Insufficient documentation

## 2018-04-07 DIAGNOSIS — D72820 Lymphocytosis (symptomatic): Secondary | ICD-10-CM

## 2018-04-07 DIAGNOSIS — D469 Myelodysplastic syndrome, unspecified: Secondary | ICD-10-CM | POA: Diagnosis not present

## 2018-04-07 DIAGNOSIS — Z79899 Other long term (current) drug therapy: Secondary | ICD-10-CM

## 2018-04-07 DIAGNOSIS — R58 Hemorrhage, not elsewhere classified: Secondary | ICD-10-CM | POA: Diagnosis not present

## 2018-04-07 NOTE — Progress Notes (Signed)
Patient here for follow up. Patient complaints of shortness of breath when laying and with little exertion.  Pt states that hands are still swollen and feet are hurting.

## 2018-04-12 NOTE — Progress Notes (Signed)
Hematology/Oncology follow up Note [facility] Telephone:([phone]) [phone] [phone]) [phone]   Patient Care Team: Kirk Ruths, MD as PCP - General (Internal Medicine)  REFERRING PROVIDER: Waylan Rocher REASON FOR VISIT Follow up for mangement of pancytopenia  HISTORY OF PRESENTING ILLNESS:  Juan Nguyen is a  72 y.o.  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 Patient has been pancytopenic chronically, previous hematology evaluation was obtained remotely, with a bone marrow biopsy.  Patient has multiple chronic problems and follows up mainly at [place]. Extensive chart review of medical charts in [facility] through care everywhere was performed by me. #Rheumatology problem: Systemic lupus was diagnosed in 2006, patient was started on Imuran in 2007 and discontinued in 2008 due to leukopenia.  He was also on Plaquenil which was discontinued 2007 and discontinued due to cost issue To establish care with [facility] currently on prednisone  Patient also had chronic hepatitis C history, status post Ribavirin interferon alfa treatment.  Hepatitis viral load undetectable last tested in January 2019. #Chronic bilateral lower extremity swelling, no aggravating or alleviating factors. Weight loss, chronic problem for patient.  Reports appetite is continued to have weight loss  #Last colonoscopy in January 2019 which showed diverticulosis and colon polyps. #Easy bruising: Chronic, waxing and waning.  Patient showed me an area of severe bruising after a minor trauma of his left elbow. Denies hematochezia, hematuria, # hematemesis, epistaxis, black tarry stool.   INTERVAL HISTORY Juan Nguyen is a 72 y.o. male who has above history reviewed by me today presents for pancytopenia.     # S/p IV venofer weekly x 3. Complained about lower extremity swelling after each Venofer dose.  # Continue to have chronic easy  bruising, no active bleeding.  # B12 deficiency, s/p Weekly B12 1036mg x 3 doses.  # Fatigue: reports worsening fatigue. Chronic onset, perisistent, no aggravating or improving factors, no associated symptoms.   # He was recently seen by Dr.Kernodle. plaquenil stopped and patient is low dose prednisone 111mdaily.  During the interval he has had bone marrow biopsy done and presents for discussion of bone marrow biopsy results.   Review of Systems  Constitutional: Positive for malaise/fatigue and weight loss. Negative for chills and fever.  HENT: Negative for congestion, ear discharge, ear pain, nosebleeds, sinus pain and sore throat.   Eyes: Negative for double vision, photophobia, pain, discharge and redness.  Respiratory: Negative for cough, hemoptysis, sputum production, shortness of breath and wheezing.   Cardiovascular: Negative for chest pain, palpitations, orthopnea, claudication and leg swelling.  Gastrointestinal: Negative for abdominal pain, blood in stool, constipation, diarrhea, heartburn, melena, nausea and vomiting.  Genitourinary: Negative for dysuria, flank pain, frequency and hematuria.  Musculoskeletal: Negative for back pain, myalgias and neck pain.       Leg swelling  Skin: Negative for itching and rash.  Neurological: Negative for dizziness, tingling, tremors, focal weakness, weakness and headaches.  Endo/Heme/Allergies: Negative for environmental allergies. Bruises/bleeds easily.  Psychiatric/Behavioral: Negative for depression and hallucinations. The patient is not nervous/anxious.     MEDICAL HISTORY:  Past Medical History:  Diagnosis Date  . Arthritis    RHEUMATOID  . Dyspnea   . Genital herpes   . Hepatitis    HEP.C  . Hepatitis C   . Hypercholesteremia   . Hypertension   . [place]  . [place]  . [place]  SURGICAL HISTORY: Past Surgical History:  Procedure Laterality Date  . COLONOSCOPY    . COLONOSCOPY WITH  PROPOFOL N/A 07/14/2017   Procedure: COLONOSCOPY WITH PROPOFOL;  Surgeon: Lollie Sails, MD;  Location: [facility] ENDOSCOPY;  Service: Endoscopy;  Laterality: N/A;  . ESOPHAGOGASTRODUODENOSCOPY (EGD) WITH PROPOFOL N/A 03/22/2018   Procedure: ESOPHAGOGASTRODUODENOSCOPY (EGD) WITH PROPOFOL;  Surgeon: Lollie Sails, MD;  Location: [facility] ENDOSCOPY;  Service: Endoscopy;  Laterality: N/A;  . HERNIA REPAIR Left   . INGUINAL HERNIA REPAIR Right 08/04/2017   Procedure: HERNIA REPAIR INGUINAL ADULT;  Surgeon: Leonie Green, MD;  Location: [facility] ORS;  Service: General;  Laterality: Right;  . KNEE ARTHROSCOPY      SOCIAL HISTORY: Social History   Socioeconomic History  . Marital status: Married    Spouse name: Not on file  . Number of children: Not on file  . Years of education: Not on file  . Highest education level: Not on file  Occupational History  . Not on file  Social Needs  . Financial resource strain: Not on file  . Food insecurity:    Worry: Patient refused    Inability: Patient refused  . Transportation needs:    Medical: Patient refused    Non-medical: Patient refused  Tobacco Use  . Smoking status: Former Smoker    Packs/day: 0.50    Last attempt to quit: 03/22/2018    Years since quitting: 0.0  . Smokeless tobacco: Never Used  Substance and Sexual Activity  . Alcohol use: No    Frequency: Never  . 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 Sexual activity: Not on file  Lifestyle  . Physical activity:    Days per week: Patient refused    Minutes per session: Patient refused  . Stress: Not on file  Relationships  . Social connections:    Talks on phone: Not on file    Gets together: Not on file    Attends religious service: Not on file    Active member of club or organization: Not on file    Attends meetings of clubs or organizations: Not on file    Relationship status: Not on file  . Intimate partner violence:    Fear of current or ex  partner: Not on file    Emotionally abused: Not on file    Physically abused: Not on file    Forced sexual activity: Not on file  Other Topics Concern  . Not on file  Social History Narrative  . Not on file    FAMILY HISTORY: History reviewed. No pertinent family history.  ALLERGIES:  is allergic to eggs or egg-derived products; penicillins; and shellfish-derived products.  MEDICATIONS:  Current Outpatient Medications  Medication Sig Dispense Refill  . amLODipine (NORVASC) 2.5 MG tablet Take 2.5 mg by mouth daily.    . diazepam (VALIUM) 5 MG tablet Take 5 mg by mouth 2 (two) times daily.    . ferrous sulfate 325 (65 FE) MG EC tablet Take 1 tablet (325 mg total) by mouth 2 (two) times daily. 30 tablet 3  . lisinopril (PRINIVIL,ZESTRIL) 20 MG tablet Take 20 mg by mouth daily.    . predniSONE (DELTASONE) 10 MG tablet Take 10 mg by mouth daily with breakfast.    . sildenafil (VIAGRA) 100 MG tablet Take 100 mg by mouth daily as needed for erectile dysfunction.    . dorzolamide-timolol (COSOPT) 22.3-6.8 MG/ML ophthalmic solution INSTILL 1 DROP INTO  BOTH EYES TWICE A DAY  6  . latanoprost (XALATAN) 0.005 % ophthalmic solution Place 1 drop into both eyes at bedtime.  6   No current facility-administered medications for this visit.      PHYSICAL EXAMINATION: ECOG PERFORMANCE STATUS: 1 - Symptomatic but completely ambulatory Vitals:   04/07/18 1323  BP: 123/76  Pulse: 77  Resp: 18  Temp: 98.8 F (37.1 C)  SpO2: 93%   Filed Weights   04/07/18 1323  Weight: 129 lb 6.4 oz (58.7 kg)    Physical Exam  Constitutional: He is oriented to person, place, and time. No distress.  thin  HENT:  Head: Normocephalic and atraumatic.  Mouth/Throat: Oropharynx is clear and moist.  Eyes: Pupils are equal, round, and reactive to light. Conjunctivae and EOM are normal. No scleral icterus.  Neck: Normal range of motion. Neck supple.  Cardiovascular: Normal rate, regular rhythm and normal heart  sounds.  Pulmonary/Chest: Effort normal and breath sounds normal. No respiratory distress. He has no wheezes. He has no rales. He exhibits no tenderness.  Abdominal: Soft. Bowel sounds are normal. He exhibits no distension and no mass. There is no tenderness.  Musculoskeletal: Normal range of motion. He exhibits edema. He exhibits no deformity.  Lymphadenopathy:    He has no cervical adenopathy.  Neurological: He is alert and oriented to person, place, and time. No cranial nerve deficit. Coordination normal.  Skin: Skin is warm and dry. No rash noted. No erythema.  Small area of bruising/echymosis,of left upper extremity   Psychiatric: He has a normal mood and affect. His behavior is normal. Thought content normal.  Anxiety.     LABORATORY DATA:  I have reviewed the data as listed Lab Results  Component Value Date   WBC 5.0 03/24/2018   HGB 11.1 (L) 03/24/2018   HCT 34.0 (L) 03/24/2018   MCV 69.8 (L) 03/24/2018   PLT 72 (L) 03/24/2018   Recent Labs    01/13/18 1609 03/31/18 0919  NA 139  --   K 4.7  --   CL 102  --   CO2 28  --   GLUCOSE 117*  --   BUN 25*  --   CREATININE 0.73 0.70  CALCIUM 8.8*  --   GFRNONAA >60  --   GFRAA >60  --   PROT 8.5*  --   ALBUMIN 4.1  --   AST 18  --   ALT 8  --   ALKPHOS 50  --   BILITOT 1.2  --    Iron/TIBC/Ferritin/ %Sat    Component Value Date/Time   IRON 19 (L) 02/22/2018 1337   TIBC 282 02/22/2018 1337   FERRITIN 73 02/22/2018 1337   IRONPCTSAT 7 (L) 02/22/2018 1337    RADIOGRAPHIC STUDIES: I have personally reviewed the radiological images as listed and agreed with the findings in the report. 09/05/2016 2D echoL LVEF 55% 6/20/2017retroperitoneal [place]:  renal cysts, enlarged prostate.  07/29/2017 US abdomen: gallstone without acute cholecystitis, normal liver, spleen, pancrease. Renal cysts.  01/29/2018 CT chest abdomen pelvis.  1. No findings to explain the patient's symptoms. 2. Aortic atherosclerosis (ICD10-170.0). Coronary  artery Calcification. 3.  Emphysema (ICD10-J43.9). 4. Cholelithiasis. 5. Probable simple or mildly complex renal cysts, difficult to definitively characterize without precontrast imaging. Lesions are previously evaluated by ultrasound on 07/29/2017.  Fibroscan demonstrated f2/f3 fibrosis  ASSESSMENT & PLAN:  1. Thrombocytopenia (HCC)   2. Easy bruising   3. Iron deficiency anemia, unspecified iron deficiency anemia type  4. Monoclonal B-cell lymphocytosis    Discussed with patient and his wife that bone marrow morphology showed no significant dysplasia, no blast, but I am concerned about his cytogenetics. I called pathology department and pathologist Dr.Manny was on vacation. I informed patient that I will discuss with Dr.Manny when she is back. Patient requests me to call his wife when I have additional information.   # Discussed with pathologist Dr.Manny about patient's bone marrow biopsy report and cytogenetics. Dr.Manny addendum the report.  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 Patient is diagnosed with MDS, unclassifiable.   IPSS score 1, INT-1 IPSS R score 3.5, INT  Call wife and discussed the results with her. She will relay information to patient.  I will arrange patient to come back in the clinic and discuss about management plan.   Orders Placed This Encounter  Procedures  . CBC with Differential/Platelet    Standing Status:   Future    Standing Expiration Date:   04/08/2019  . Comprehensive metabolic panel    Standing Status:   Future    Standing Expiration Date:   04/08/2019   Total face to face encounter time for this patient visit was 25 min. >50% of the time was  spent in counseling and coordination of care.   Earlie Server, MD, PhD Hematology Oncology [facility] at [facility] Pager- [phone] 04/12/2018

## 2018-04-14 ENCOUNTER — Inpatient Hospital Stay (HOSPITAL_BASED_OUTPATIENT_CLINIC_OR_DEPARTMENT_OTHER): Payer: Medicare HMO | Admitting: Oncology

## 2018-04-14 ENCOUNTER — Other Ambulatory Visit: Payer: Self-pay

## 2018-04-14 ENCOUNTER — Encounter: Payer: Self-pay | Admitting: Oncology

## 2018-04-14 VITALS — BP 130/82 | HR 78 | Temp 97.6°F | Resp 18 | Wt 126.3 lb

## 2018-04-14 DIAGNOSIS — D696 Thrombocytopenia, unspecified: Secondary | ICD-10-CM

## 2018-04-14 DIAGNOSIS — D509 Iron deficiency anemia, unspecified: Secondary | ICD-10-CM | POA: Diagnosis not present

## 2018-04-14 DIAGNOSIS — Z79899 Other long term (current) drug therapy: Secondary | ICD-10-CM

## 2018-04-14 DIAGNOSIS — D469 Myelodysplastic syndrome, unspecified: Secondary | ICD-10-CM

## 2018-04-15 NOTE — Progress Notes (Signed)
Hematology/Oncology follow up Note [facility] Telephone:([phone]) [phone] Fax:([phone]) [phone]   Patient Care Team: Kirk Ruths, MD as PCP - General (Internal Medicine)  REFERRING PROVIDER: Waylan Rocher REASON FOR VISIT Follow up for mangement of pancytopenia  HISTORY OF PRESENTING ILLNESS:  Juan Nguyen is a  72 y.o.  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 Patient has been pancytopenic chronically, previous hematology evaluation was obtained remotely, with a bone marrow biopsy.  Patient has multiple chronic problems and follows up mainly at [place]. Extensive chart review of medical charts in [facility] through care everywhere was performed by me. #Rheumatology problem: Systemic lupus was diagnosed in 2006, patient was started on Imuran in 2007 and discontinued in 2008 due to leukopenia.  He was also on Plaquenil which was discontinued 2007 and discontinued due to cost issue To establish care with [facility] currently on prednisone  Patient also had chronic hepatitis C history, status post Ribavirin interferon alfa treatment.  Hepatitis viral load undetectable last tested in January 2019. #Chronic bilateral lower extremity swelling, no aggravating or alleviating factors. Weight loss, chronic problem for patient.  Reports appetite is continued to have weight loss  #Last colonoscopy in January 2019 which showed diverticulosis and colon polyps. #Easy bruising: Chronic, waxing and waning.  Patient showed me an area of severe bruising after a minor trauma of his left elbow. Denies hematochezia, hematuria, # hematemesis, epistaxis, black tarry stool.    # # S/p IV venofer weekly x 3. Complained about lower extremity swelling after each Venofer dose.  # B12 deficiency, s/p Weekly B12 1026mg x 3 doses.   INTERVAL HISTORY REMMAUEL HALLUMSis a 72y.o. male who has above history reviewed by me today  presents for pancytopenia.    During the interval he underwent bone marrow biopsy and presents to discuss results.  Reports feeling very anxious.   # Continue to have easy bruising. No activing bleeding.  Fatigue: reports worsening fatigue. Chronic onset, perisistent, no aggravating or improving factors, no associated symptoms.  # He was recently seen by Dr.Kernodle. plaquenil stopped and patient is low dose prednisone 172mdaily.     Review of Systems  Constitutional: Positive for malaise/fatigue. Negative for chills, fever and weight loss.  HENT: Negative for congestion, ear discharge, ear pain, nosebleeds, sinus pain and sore throat.   Eyes: Negative for double vision, photophobia, pain, discharge and redness.  Respiratory: Negative for cough, hemoptysis, sputum production, shortness of breath and wheezing.   Cardiovascular: Negative for chest pain, palpitations, orthopnea, claudication and leg swelling.  Gastrointestinal: Negative for abdominal pain, blood in stool, constipation, diarrhea, heartburn, melena, nausea and vomiting.  Genitourinary: Negative for dysuria, flank pain, frequency and hematuria.  Musculoskeletal: Negative for back pain, myalgias and neck pain.       Leg swelling  Skin: Negative for itching and rash.  Neurological: Negative for dizziness, tingling, tremors, focal weakness, weakness and headaches.  Endo/Heme/Allergies: Negative for environmental allergies. Bruises/bleeds easily.  Psychiatric/Behavioral: Negative for depression and hallucinations. The patient is not nervous/anxious.     MEDICAL HISTORY:  Past Medical History:  Diagnosis Date  . Arthritis    RHEUMATOID  . Dyspnea   . Genital herpes   . Hepatitis    HEP.C  . Hepatitis C   . Hypercholesteremia   . Hypertension   . Lupus ([place]  . [place] ([place]  . S[place]ren's syndrome ([place]  SURGICAL HISTORY: Past Surgical History:  Procedure Laterality Date  . COLONOSCOPY    . COLONOSCOPY  WITH PROPOFOL N/A 07/14/2017   Procedure: COLONOSCOPY WITH PROPOFOL;  Surgeon: Lollie Sails, MD;  Location[facility] ENDOSCOPY;  Service: Endoscopy;  Laterality: N/A;  . ESOPHAGOGASTRODUODENOSCOPY (EGD) WITH PROPOFOL N/A 03/22/2018   Procedure: ESOPHAGOGASTRODUODENOSCOPY (EGD) WITH PROPOFOL;  Surgeon: Lollie Sails, MD;  Location: [facility] ENDOSCOPY;  Service: Endoscopy;  Laterality: N/A;  . HERNIA REPAIR Left   . INGUINAL HERNIA REPAIR Right 08/04/2017   Procedure: HERNIA REPAIR INGUINAL ADULT;  Surgeon: Leonie Green, MD;  Location: [facility] ORS;  Service: General;  Laterality: Right;  . KNEE ARTHROSCOPY      SOCIAL HISTORY: Social History   Socioeconomic History  . Marital status: Married    Spouse name: Not on file  . Number of children: Not on file  . Years of education: Not on file  . Highest education level: Not on file  Occupational History  . Not on file  Social Needs  . Financial resource strain: Not on file  . Food insecurity:    Worry: Patient refused    Inability: Patient refused  . Transportation needs:    Medical: Patient refused    Non-medical: Patient refused  Tobacco Use  . Smoking status: Former Smoker    Packs/day: 0.50    Last attempt to quit: 03/22/2018    Years since quitting: 0.0  . Smokeless tobacco: Never Used  Substance and Sexual Activity  . Alcohol use: No    Frequency: Never  . 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 Sexual activity: Not on file  Lifestyle  . Physical activity:    Days per week: Patient refused    Minutes per session: Patient refused  . Stress: Not on file  Relationships  . Social connections:    Talks on phone: Not on file    Gets together: Not on file    Attends religious service: Not on file    Active member of club or organization: Not on file    Attends meetings of clubs or organizations: Not on file    Relationship status: Not on file  . Intimate partner violence:    Fear of current or ex  partner: Not on file    Emotionally abused: Not on file    Physically abused: Not on file    Forced sexual activity: Not on file  Other Topics Concern  . Not on file  Social History Narrative  . Not on file    FAMILY HISTORY: History reviewed. No pertinent family history.  ALLERGIES:  is allergic to eggs or egg-derived products; penicillins; and shellfish-derived products.  MEDICATIONS:  Current Outpatient Medications  Medication Sig Dispense Refill  . amLODipine (NORVASC) 2.5 MG tablet Take 2.5 mg by mouth daily.    . diazepam (VALIUM) 5 MG tablet Take 5 mg by mouth 2 (two) times daily as needed.     . dorzolamide-timolol (COSOPT) 22.3-6.8 MG/ML ophthalmic solution INSTILL 1 DROP INTO BOTH EYES TWICE A DAY  6  . ferrous sulfate 325 (65 FE) MG EC tablet Take 1 tablet (325 mg total) by mouth 2 (two) times daily. 30 tablet 3  . latanoprost (XALATAN) 0.005 % ophthalmic solution Place 1 drop into both eyes at bedtime.  6  . lisinopril (PRINIVIL,ZESTRIL) 20 MG tablet Take 20 mg by mouth daily.    . predniSONE (DELTASONE) 10 MG tablet Take 10  mg by mouth daily with breakfast.    . sildenafil (VIAGRA) 100 MG tablet Take 100 mg by mouth daily as needed for erectile dysfunction.     No current facility-administered medications for this visit.      PHYSICAL EXAMINATION: ECOG PERFORMANCE STATUS: 1 - Symptomatic but completely ambulatory Vitals:   04/14/18 1344  BP: 130/82  Pulse: 78  Resp: 18  Temp: 97.6 F (36.4 C)   Filed Weights   04/14/18 1344  Weight: 126 lb 4.8 oz (57.3 kg)    Physical Exam  Constitutional: He is oriented to person, place, and time. No distress.  thin  HENT:  Head: Normocephalic and atraumatic.  Mouth/Throat: Oropharynx is clear and moist.  Eyes: Pupils are equal, round, and reactive to light. Conjunctivae and EOM are normal. No scleral icterus.  Neck: Normal range of motion. Neck supple.  Cardiovascular: Normal rate, regular rhythm and normal heart  sounds.  Pulmonary/Chest: Effort normal and breath sounds normal. No respiratory distress. He has no wheezes. He has no rales. He exhibits no tenderness.  Abdominal: Soft. Bowel sounds are normal. He exhibits no distension and no mass. There is no tenderness.  Musculoskeletal: Normal range of motion. He exhibits edema. He exhibits no deformity.  Lymphadenopathy:    He has no cervical adenopathy.  Neurological: He is alert and oriented to person, place, and time. No cranial nerve deficit. Coordination normal.  Skin: Skin is warm and dry. No rash noted. No erythema.  Small area of bruising/echymosis,of left upper extremity   Psychiatric: He has a normal mood and affect. His behavior is normal. Thought content normal.  Anxiety.     LABORATORY DATA:  I have reviewed the data as listed Lab Results  Component Value Date   WBC 5.0 03/24/2018   HGB 11.1 (L) 03/24/2018   HCT 34.0 (L) 03/24/2018   MCV 69.8 (L) 03/24/2018   PLT 72 (L) 03/24/2018   Recent Labs    01/13/18 1609 03/31/18 0919  NA 139  --   K 4.7  --   CL 102  --   CO2 28  --   GLUCOSE 117*  --   BUN 25*  --   CREATININE 0.73 0.70  CALCIUM 8.8*  --   GFRNONAA >60  --   GFRAA >60  --   PROT 8.5*  --   ALBUMIN 4.1  --   AST 18  --   ALT 8  --   ALKPHOS 50  --   BILITOT 1.2  --    Iron/TIBC/Ferritin/ %Sat    Component Value Date/Time   IRON 19 (L) 02/22/2018 1337   TIBC 282 02/22/2018 1337   FERRITIN 73 02/22/2018 1337   IRONPCTSAT 7 (L) 02/22/2018 1337    RADIOGRAPHIC STUDIES: I have personally reviewed the radiological images as listed and agreed with the findings in the report. 09/05/2016 2D echoL LVEF 55% 6/20/2017retroperitoneal [place]:  renal cysts, enlarged prostate.  07/29/2017 US abdomen: gallstone without acute cholecystitis, normal liver, spleen, pancrease. Renal cysts.  01/29/2018 CT chest abdomen pelvis.  1. No findings to explain the patient's symptoms. 2. Aortic atherosclerosis (ICD10-170.0). Coronary  artery Calcification. 3.  Emphysema (ICD10-J43.9). 4. Cholelithiasis. 5. Probable simple or mildly complex renal cysts, difficult to definitively characterize without precontrast imaging. Lesions are previously evaluated by ultrasound on 07/29/2017.  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 Iron deficiency anemia, unspecified iron deficiency anemia type   3. Thrombocytopenia (  [place])    Discussed with patient and his wife that bone marrow morphology showed no significant dysplasia, no blast, but I am concerned about his cytogenetics. I called pathology department and pathologist Dr.Manny was on vacation. I informed patient that I will discuss with Dr.Manny when she is back. Patient requests me to call his wife when I have additional information.  # Discussed with pathologist Dr.Manny about patient's bone marrow biopsy report and cytogenetics. Dr.Manny addendum the report.  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 Patient is diagnosed with MDS, unclassifiable.  IPSS score 1, INT-1 IPSS R score 3.5, INT  Results were communicated with wife on the phone and patient prefers another discussion with him face to face to discuss about management plan.  The diagnosis and care plan were discussed with patient in detail.  NCCN guidelines were reviewed and shared with patient.   Patient has been chronically thrombocytopenic, minimal morphology changes, diagnosed with MDS due to MDS defining cytogenetic abnormality, I suggest observation for now. Discussed with him that I can refer him to tertiary center for a second opinion and for bone marrow transplant evaluation.  Patient and wife declined referral and not interested currently.   We spent sufficient time to discuss many aspect of care, questions were answered to patient's satisfaction.  Orders Placed This  Encounter  Procedures  . CBC with Differential/Platelet    Standing Status:   Future    Standing Expiration Date:   04/15/2019  . Iron and TIBC    Standing Status:   Future    Standing Expiration Date:   04/15/2019  . Comprehensive metabolic panel    Standing Status:   Future    Standing Expiration Date:   04/15/2019  . Ferritin    Standing Status:   Future    Standing Expiration Date:   04/15/2019  Total face to face encounter time for this patient visit was 56mn. >50% of the time was  spent in counseling and coordination of care.  Follow up in 3 months.   ZEarlie Server MD, PhD Hematology Oncology CMethodist [facility] Hospitalat ABeverly Campus Beverly CampusPager- [phone]/04/2018

## 2018-04-21 ENCOUNTER — Telehealth: Payer: Self-pay | Admitting: *Deleted

## 2018-04-21 NOTE — Telephone Encounter (Signed)
Please advice that if he has acute mental status changes after a fall, should go to ER.  I do not think his MDS causes hands and leg swelling/SOB. He should see primary care physician for further work up.   We can move up his appointment to Nov. Thanks.

## 2018-04-21 NOTE — Telephone Encounter (Signed)
Wife reports that hands and legs are swelling, he fell yesterday and he seems confused. SHORT OF BREATH with activity. Asking if he needs to be seen sooner than Jan appt

## 2018-04-21 NOTE — Telephone Encounter (Signed)
Wife called and asks to speak with Dr Tasia Catchings regarding what she is supposed to be looking for regarding her husbands symptoms. Please return her call [phone]

## 2018-04-21 NOTE — Telephone Encounter (Signed)
Please let wife know about these things to watch.  Active Bleeding not responding to applying pressure.  fever, chills,

## 2018-04-22 ENCOUNTER — Other Ambulatory Visit
Admission: RE | Admit: 2018-04-22 | Discharge: 2018-04-22 | Disposition: A | Discharge status: Home / Self Care | Payer: Medicare HMO | Source: Ambulatory Visit | Attending: Gastroenterology | Admitting: Gastroenterology

## 2018-04-22 DIAGNOSIS — D509 Iron deficiency anemia, unspecified: Secondary | ICD-10-CM | POA: Diagnosis present

## 2018-04-22 NOTE — Telephone Encounter (Signed)
The mental changes have been happening for a while but seem to be getting worse. She will contact his PCP for appointment for further evaluation

## 2018-04-29 LAB — MISCELLANEOUS TEST

## 2018-04-30 ENCOUNTER — Other Ambulatory Visit: Payer: Self-pay | Admitting: Gastroenterology

## 2018-04-30 DIAGNOSIS — D509 Iron deficiency anemia, unspecified: Secondary | ICD-10-CM

## 2018-05-05 ENCOUNTER — Ambulatory Visit
Admission: RE | Admit: 2018-05-05 | Discharge: 2018-05-05 | Disposition: A | Discharge status: Home / Self Care | Payer: Medicare HMO | Source: Ambulatory Visit | Attending: Gastroenterology | Admitting: Gastroenterology

## 2018-05-05 DIAGNOSIS — K900 Celiac disease: Secondary | ICD-10-CM | POA: Diagnosis present

## 2018-05-05 DIAGNOSIS — D509 Iron deficiency anemia, unspecified: Secondary | ICD-10-CM | POA: Diagnosis present

## 2018-05-18 ENCOUNTER — Ambulatory Visit: Payer: Medicare HMO | Admitting: Urology

## 2018-05-18 ENCOUNTER — Ambulatory Visit
Admission: RE | Admit: 2018-05-18 | Discharge: 2018-05-18 | Disposition: A | Discharge status: Home / Self Care | Payer: Medicare HMO | Source: Ambulatory Visit | Attending: Urology | Admitting: Urology

## 2018-05-18 ENCOUNTER — Other Ambulatory Visit: Payer: Self-pay

## 2018-05-18 ENCOUNTER — Encounter: Payer: Self-pay | Admitting: Urology

## 2018-05-18 VITALS — BP 130/60 | HR 66 | Ht 68.0 in | Wt 120.5 lb

## 2018-05-18 DIAGNOSIS — K900 Celiac disease: Secondary | ICD-10-CM | POA: Insufficient documentation

## 2018-05-18 DIAGNOSIS — N281 Cyst of kidney, acquired: Secondary | ICD-10-CM | POA: Diagnosis not present

## 2018-05-18 DIAGNOSIS — R310 Gross hematuria: Secondary | ICD-10-CM

## 2018-05-18 DIAGNOSIS — N200 Calculus of kidney: Secondary | ICD-10-CM | POA: Insufficient documentation

## 2018-05-18 DIAGNOSIS — D509 Iron deficiency anemia, unspecified: Secondary | ICD-10-CM | POA: Diagnosis present

## 2018-05-18 DIAGNOSIS — K8020 Calculus of gallbladder without cholecystitis without obstruction: Secondary | ICD-10-CM | POA: Diagnosis present

## 2018-05-18 MED ORDER — IOPAMIDOL (ISOVUE-300) INJECTION 61%: 100.0000 mL | Freq: Once | INTRAVENOUS | Status: DC | PRN

## 2018-05-18 NOTE — Progress Notes (Signed)
05/18/2018 5:22 PM   Juan Nguyen May 25, 1946 [id number]  Referring provider: Kirk Ruths, MD [facility] [place], [place] [address]  CC: Gross hematuria  HPI: I saw Mr. Juan Nguyen in urology clinic today in consultation for gross hematuria from Dr. Ouida Nguyen.  He is here with his wife today who provides much of the history.  He has had 9 days of severe gross hematuria with dark red blood in the urine.  There are no aggravating or alleviating factors.  He denies any pain.  He reports he has not seen any large blood clots in the urine.  He reports he still voiding with a strong stream.  He denies any fevers, chills, or flank pain.  He does have 30 pound of weight loss over the last 10 months.  He is a 50-pack-year smoking history, but denies any exposure to carcinogenic's.  His past medical history is notable for MDS followed by hematology/oncology.  Most recent platelets are 107.  He is not anticoagulated.  He was started on antibiotics by his PCP for a possible UTI, however urine culture 11/4 was negative.  He does not feel that the antibiotics have slowed his hematuria at all.  Cross-sectional imaging with MR abdomen and CT abdomen with contrast in September and July 2019 respectively showed benign-appearing renal cyst bilaterally, no hydronephrosis.  The bladder was not imaged on these studies.   PMH: Past Medical History:  Diagnosis Date  . Arthritis    RHEUMATOID  . Dyspnea   . Genital herpes   . Hepatitis    HEP.C  . Hepatitis C   . Hypercholesteremia   . Hypertension   . Lupus ([place])   . Pancytopenia ([place])   . Sjogren's syndrome [facility])     Surgical History: Past Surgical History:  Procedure Laterality Date  . COLONOSCOPY    . COLONOSCOPY WITH PROPOFOL N/A 07/14/2017   Procedure: COLONOSCOPY WITH PROPOFOL;  Surgeon: Lollie Sails, MD;  Location[facility] ENDOSCOPY;  Service: Endoscopy;  Laterality: N/A;  . ESOPHAGOGASTRODUODENOSCOPY  (EGD) WITH PROPOFOL N/A 03/22/2018   Procedure: ESOPHAGOGASTRODUODENOSCOPY (EGD) WITH PROPOFOL;  Surgeon: Lollie Sails, MD;  Location: [facility] ENDOSCOPY;  Service: Endoscopy;  Laterality: N/A;  . HERNIA REPAIR Left   . INGUINAL HERNIA REPAIR Right 08/04/2017   Procedure: HERNIA REPAIR INGUINAL ADULT;  Surgeon: Leonie Green, MD;  Location: [facility] ORS;  Service: General;  Laterality: Right;  . KNEE ARTHROSCOPY       Allergies:  Allergies  Allergen Reactions  . Eggs Or Egg-Derived Products Nausea Only  . Penicillins Other (See Comments)    Unknown Has patient had a PCN reaction causing immediate rash, facial/tongue/throat swelling, SOB or lightheadedness with hypotension: Unknown Has patient had a PCN reaction causing severe rash involving mucus membranes or skin necrosis: Unknown Has patient had a PCN reaction that required hospitalization: Unknown Has patient had a PCN reaction occurring within the last 10 years: Unknown If all of the above answers are "NO", then may proceed with Cephalosporin use.   . Shellfish-Derived Products Hives    Family History: History reviewed. No pertinent family history.  Social History:  reports that he quit smoking about 8 weeks ago. He smoked 0.50 packs per day. He has never used smokeless tobacco. He reports that he does not drink alcohol or use drugs.  ROS: Please see flowsheet from today's date for complete review of systems.  Physical Exam: BP 130/60   Pulse 66  Ht 5' 8"  (1.727 m)   Wt 120 lb 8 oz (54.7 kg)   BMI 18.32 kg/m    Constitutional: Slightly confused, pleasant Cardiovascular: No clubbing, cyanosis, or edema. Respiratory: Normal respiratory effort, no increased work of breathing. GI: Abdomen is soft, nontender, nondistended, no abdominal masses GU: No CVA tenderness, phallus without lesions, widely patent meatus Lymph: No cervical or inguinal lymphadenopathy. Skin: No rashes, bruises or suspicious lesions. Neurologic:  Grossly intact, no focal deficits, moving all 4 extremities. Psychiatric: Normal mood and affect.  Laboratory Data: Reviewed Creatinine 0.7 Platelets 107  Urinalysis today 11-30 WBCs, greater than 30 RBCs, many bacteria, nitrite negative  Pertinent Imaging: I have personally reviewed the CT abdomen with contrast and MR abdomen with contrast.  Simple appearing renal cyst bilaterally, no hydronephrosis.  Bladder not imaged.  Assessment & Plan:   In summary, Mr. Juan Nguyen is a 72 year old comorbid male with history of MDS, lupus, 30 pounds of weight loss, extensive smoking history, and 9 days of severe gross hematuria.  We discussed common possible etiologies of hematuria including BPH, malignancy, urolithiasis, medical renal disease, and idiopathic. Standard workup recommended by the AUA includes imaging with CT urogram to assess the upper tracts, and cystoscopy. Cytology is performed on patient's with gross hematuria to look for malignant cells in the urine.  His significant weight loss and severe hematuria with extensive smoking history are concerning for possible bladder malignancy.  I ordered a stat CT urogram today.  We will follow-up these results and reach out to the patient and his wife tomorrow to discuss the findings.  If bladder tumor is seen, will expedite OR for TURBT.  If CT urogram is normal, likely add on for clinic cystoscopy next week.  Juan Nguyen, [facility] [address], [place], [place] [address] [phone]

## 2018-05-19 ENCOUNTER — Telehealth: Payer: Self-pay

## 2018-05-19 LAB — MICROSCOPIC EXAMINATION

## 2018-05-19 LAB — URINALYSIS, COMPLETE
Bilirubin, UA: NEGATIVE
GLUCOSE, UA: NEGATIVE
NITRITE UA: NEGATIVE
SPEC GRAV UA: 1.02 (ref 1.005–1.030)
Urobilinogen, Ur: 4 mg/dL — ABNORMAL HIGH (ref 0.2–1.0)
pH, UA: 5.5 (ref 5.0–7.5)

## 2018-05-19 NOTE — Telephone Encounter (Signed)
-----   Message from Billey Co, MD sent at 05/19/2018  8:16 AM EST ----- Regarding: CT results Please let the patient and his wife (patient has some dementia) know that we were unable to visualize anything on the CT because of the recent barium study he had done. All the contrast is still in his bowels and blocks the bladder.  Encourage him to push fluids to clear his hematuria.  Please set up clinic cystoscopy next week, OK to overbook. We will then likely repeat the CT scan in ~2 weeks or so when the barium contrast has cleared his system.  Thanks Nickolas Madrid, MD 05/19/2018

## 2018-05-19 NOTE — Telephone Encounter (Signed)
Pt wife informed, appointment made for 05/24/18 @ 1030. She will encourage increased fluid intake.

## 2018-05-19 NOTE — Telephone Encounter (Signed)
Left message for pt wife to call our office to discuss CT.

## 2018-05-19 NOTE — Telephone Encounter (Signed)
Juan Nguyen lm to cb to go over results and I have scheduled his cysto please advise patient   Sharyn Lull

## 2018-05-24 ENCOUNTER — Encounter: Payer: Self-pay | Admitting: Urology

## 2018-05-24 ENCOUNTER — Other Ambulatory Visit: Payer: Self-pay

## 2018-05-24 ENCOUNTER — Ambulatory Visit (INDEPENDENT_AMBULATORY_CARE_PROVIDER_SITE_OTHER): Payer: Medicare HMO | Admitting: Urology

## 2018-05-24 VITALS — BP 128/80 | HR 85 | Wt 126.6 lb

## 2018-05-24 DIAGNOSIS — R310 Gross hematuria: Secondary | ICD-10-CM | POA: Diagnosis not present

## 2018-05-24 LAB — URINALYSIS, COMPLETE
Bilirubin, UA: NEGATIVE
Glucose, UA: NEGATIVE
Ketones, UA: NEGATIVE
Leukocytes, UA: NEGATIVE
NITRITE UA: NEGATIVE
PH UA: 5.5 (ref 5.0–7.5)
Specific Gravity, UA: 1.025 (ref 1.005–1.030)
UUROB: 1 mg/dL (ref 0.2–1.0)

## 2018-05-24 LAB — MICROSCOPIC EXAMINATION
EPITHELIAL CELLS (NON RENAL): NONE SEEN /HPF (ref 0–10)
WBC, UA: NONE SEEN /hpf (ref 0–5)

## 2018-05-24 NOTE — Progress Notes (Signed)
Cystoscopy Procedure Note:  Indication: Gross hematuria  After informed consent and discussion of the procedure and its risks, Juan Nguyen was positioned and prepped in the standard fashion. Cystoscopy was performed with a flexible cystoscope. Urine cloudy and visualization poor, bladder drained and refilled. Urine sent for cytology. The urethra, bladder neck and entire bladder was visualized in a standard fashion. The prostate was moderate in size with intravesical lobe. Mild oozing from bladder neck at prostate. Moderate trabeculations. The ureteral orifices were visualized in their normal location and orientation. Retroflexion did not show any obvious tumors.  Imaging: CT without contrast dated 05/18/2018 personally reviewed. Pelvis completely obscured by artifact from prior residual barium, will need to repeat imaging after barium has passed.  Findings: No bladder tumors, mild oozing from prostate  Assessment and Plan: Follow up cytology Obtain CT urogram in ~2 weeks when barium has cleared Will call with results  Nickolas Madrid, MD 05/24/2018

## 2018-05-27 ENCOUNTER — Other Ambulatory Visit: Payer: Self-pay | Admitting: Urology

## 2018-06-11 ENCOUNTER — Ambulatory Visit
Admission: RE | Admit: 2018-06-11 | Discharge: 2018-06-11 | Disposition: A | Discharge status: Home / Self Care | Payer: Medicare HMO | Source: Ambulatory Visit | Attending: Urology | Admitting: Urology

## 2018-06-11 DIAGNOSIS — R310 Gross hematuria: Secondary | ICD-10-CM | POA: Diagnosis not present

## 2018-06-11 LAB — POCT I-STAT CREATININE: CREATININE: 0.8 mg/dL (ref 0.61–1.24)

## 2018-06-11 MED ORDER — IOPAMIDOL (ISOVUE-300) INJECTION 61%
100.0000 mL | Freq: Once | INTRAVENOUS | Status: AC | PRN
  Administered 2018-06-11: 100 mL via INTRAVENOUS

## 2018-06-14 ENCOUNTER — Other Ambulatory Visit: Payer: Medicare HMO

## 2018-06-14 ENCOUNTER — Telehealth: Payer: Self-pay | Admitting: Urology

## 2018-06-14 ENCOUNTER — Ambulatory Visit: Payer: Medicare HMO | Admitting: Oncology

## 2018-06-14 NOTE — Telephone Encounter (Signed)
Spoke with pt and pt's wife about results per [place]. Pt's wife asked when someone calls to schedule pt to call her at [phone] per her he already has appts on 1/13 & 1/28 and would like to avoid those days.   Billey Co, MD  [facility]  Cc: Jorge Mandril Admin        Please let him know there were no significant abnormal findings on his CT scan.   Please set up follow-up in the next 2 to 3 weeks for symptom check and discuss options for hematuria   Nickolas Madrid, MD  06/12/2018

## 2018-06-17 NOTE — Telephone Encounter (Signed)
App has been made and patient has been notified   Juan Nguyen

## 2018-06-17 NOTE — Telephone Encounter (Signed)
-----   Message from Billey Co, MD sent at 06/12/2018  9:43 AM EST ----- Please let him know there were no significant abnormal findings on his CT scan.  Please set up follow-up in the next 2 to 3 weeks for symptom check and discuss options for hematuria  Nickolas Madrid, MD 06/12/2018

## 2018-07-12 ENCOUNTER — Encounter: Payer: Self-pay | Admitting: Oncology

## 2018-07-12 ENCOUNTER — Other Ambulatory Visit: Payer: Self-pay

## 2018-07-12 ENCOUNTER — Inpatient Hospital Stay: Payer: Medicare HMO

## 2018-07-12 ENCOUNTER — Inpatient Hospital Stay: Payer: Medicare HMO | Attending: Oncology | Admitting: Oncology

## 2018-07-12 VITALS — BP 142/83 | HR 75 | Temp 97.3°F | Resp 18 | Wt 126.7 lb

## 2018-07-12 DIAGNOSIS — M1990 Unspecified osteoarthritis, unspecified site: Secondary | ICD-10-CM | POA: Diagnosis not present

## 2018-07-12 DIAGNOSIS — E7800 Pure hypercholesterolemia, unspecified: Secondary | ICD-10-CM | POA: Diagnosis not present

## 2018-07-12 DIAGNOSIS — D696 Thrombocytopenia, unspecified: Secondary | ICD-10-CM

## 2018-07-12 DIAGNOSIS — D72820 Lymphocytosis (symptomatic): Secondary | ICD-10-CM

## 2018-07-12 DIAGNOSIS — D469 Myelodysplastic syndrome, unspecified: Secondary | ICD-10-CM

## 2018-07-12 DIAGNOSIS — D509 Iron deficiency anemia, unspecified: Secondary | ICD-10-CM

## 2018-07-12 DIAGNOSIS — B182 Chronic viral hepatitis C: Secondary | ICD-10-CM | POA: Diagnosis not present

## 2018-07-12 DIAGNOSIS — Z87891 Personal history of nicotine dependence: Secondary | ICD-10-CM | POA: Insufficient documentation

## 2018-07-12 DIAGNOSIS — E538 Deficiency of other specified B group vitamins: Secondary | ICD-10-CM | POA: Diagnosis not present

## 2018-07-12 DIAGNOSIS — M329 Systemic lupus erythematosus, unspecified: Secondary | ICD-10-CM | POA: Insufficient documentation

## 2018-07-12 DIAGNOSIS — I10 Essential (primary) hypertension: Secondary | ICD-10-CM | POA: Insufficient documentation

## 2018-07-12 DIAGNOSIS — Z79899 Other long term (current) drug therapy: Secondary | ICD-10-CM | POA: Diagnosis not present

## 2018-07-12 LAB — CBC WITH DIFFERENTIAL/PLATELET
ABS IMMATURE GRANULOCYTES: 0.09 10*3/uL — AB (ref 0.00–0.07)
Basophils Absolute: 0 10*3/uL (ref 0.0–0.1)
Basophils Relative: 0 %
Eosinophils Absolute: 0 10*3/uL (ref 0.0–0.5)
Eosinophils Relative: 0 %
HCT: 35.1 % — ABNORMAL LOW (ref 39.0–52.0)
Hemoglobin: 10.2 g/dL — ABNORMAL LOW (ref 13.0–17.0)
Immature Granulocytes: 2 %
Lymphocytes Relative: 16 %
Lymphs Abs: 0.8 10*3/uL (ref 0.7–4.0)
MCH: 21.3 pg — AB (ref 26.0–34.0)
MCHC: 29.1 g/dL — ABNORMAL LOW (ref 30.0–36.0)
MCV: 73.4 fL — AB (ref 80.0–100.0)
MONO ABS: 0.2 10*3/uL (ref 0.1–1.0)
Monocytes Relative: 5 %
NEUTROS ABS: 3.9 10*3/uL (ref 1.7–7.7)
Neutrophils Relative %: 77 %
PLATELETS: 72 10*3/uL — AB (ref 150–400)
RBC: 4.78 MIL/uL (ref 4.22–5.81)
RDW: 17.9 % — ABNORMAL HIGH (ref 11.5–15.5)
WBC: 5.1 10*3/uL (ref 4.0–10.5)
nRBC: 0 % (ref 0.0–0.2)

## 2018-07-12 LAB — COMPREHENSIVE METABOLIC PANEL
ALT: 9 U/L (ref 0–44)
ANION GAP: 8 (ref 5–15)
AST: 15 U/L (ref 15–41)
Albumin: 4.1 g/dL (ref 3.5–5.0)
Alkaline Phosphatase: 50 U/L (ref 38–126)
BUN: 24 mg/dL — AB (ref 8–23)
CO2: 30 mmol/L (ref 22–32)
Calcium: 9.1 mg/dL (ref 8.9–10.3)
Chloride: 105 mmol/L (ref 98–111)
Creatinine, Ser: 0.75 mg/dL (ref 0.61–1.24)
GFR calc Af Amer: >60 mL/min (ref >60)
Glucose, Bld: 115 mg/dL — ABNORMAL HIGH (ref 70–99)
POTASSIUM: 4.3 mmol/L (ref 3.5–5.1)
Sodium: 143 mmol/L (ref 135–145)
Total Bilirubin: 0.9 mg/dL (ref 0.3–1.2)
Total Protein: 8 g/dL (ref 6.5–8.1)

## 2018-07-12 LAB — FERRITIN: FERRITIN: 42 ng/mL (ref 24–336)

## 2018-07-12 LAB — IRON AND TIBC
IRON: 35 ug/dL — AB (ref 45–182)
SATURATION RATIOS: 12 % — AB (ref 17.9–39.5)
TIBC: 304 ug/dL (ref 250–450)
UIBC: 269 ug/dL

## 2018-07-12 MED ORDER — FERROUS SULFATE 325 (65 FE) MG PO TBEC: 325.0000 mg | DELAYED_RELEASE_TABLET | Freq: Two times a day (BID) | ORAL | 5 refills | Status: DC

## 2018-07-12 MED ORDER — VITAMIN B-12 1000 MCG PO TABS: 1000.0000 ug | ORAL_TABLET | Freq: Every day | ORAL | 3 refills | Status: AC

## 2018-07-12 NOTE — Progress Notes (Signed)
Patient here for follow up. Pt followed by urology due to blood in urine. No more blood in urine noted. Pt complains of getting short of breath easy.

## 2018-07-12 NOTE — Progress Notes (Signed)
Hematology/Oncology follow up Note [facility] [phone] [phone]   Patient Care Team: Kirk Ruths, MD as PCP - General (Internal Medicine)  REFERRING PROVIDER: Waylan Rocher REASON FOR VISIT Follow up for mangement of MDS, iron deficiency anemia and vitamin B12 deficiency  HISTORY OF PRESENTING ILLNESS:  Juan Nguyen is a  73 y.o.  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 Patient has been pancytopenic chronically, previous hematology evaluation was obtained remotely, with a bone marrow biopsy.  Patient has multiple chronic problems and follows up mainly at [facility]. Extensive chart review of medical charts in [facility] through care everywhere was performed by me. #Rheumatology problem: Systemic lupus was diagnosed in 2006, patient was started on Imuran in 2007 and discontinued in 2008 due to leukopenia.  He was also on Plaquenil which was discontinued 2007 and discontinued due to cost issue To establish care with [facility] currently on prednisone  Patient also had chronic hepatitis C history, status post Ribavirin interferon alfa treatment.  Hepatitis viral load undetectable last tested in January 2019. #Chronic bilateral lower extremity swelling, no aggravating or alleviating factors. Weight loss, chronic problem for patient.  Reports appetite is continued to have weight loss  #Last colonoscopy in January 2019 which showed diverticulosis and colon polyps. #Easy bruising: Chronic, waxing and waning.  Patient showed me an area of severe bruising after a minor trauma of his left elbow. Denies hematochezia, hematuria, # hematemesis, epistaxis, black tarry stool.    # # S/p IV venofer weekly x 3. Complained about lower extremity swelling after each Venofer dose.  # B12 deficiency, s/p Weekly B12 1014mg x 3 doses.  # Dr.[facility]. plaquenil stopped and patient is low dose  prednisone 142mdaily.  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  [name]. [place] who has above history reviewed by me today presents for pancytopenia.   Patient reports having upper and lower extremity limb.  Cannot form fist Continue to feel fatigued.  Otherwise doing well.  Easy bruising.  No history of active bleeding.      Review of Systems  Constitutional: Positive for malaise/fatigue. Negative for chills, fever and weight loss.  HENT: Negative for congestion, ear discharge, ear pain, nosebleeds, sinus pain and sore throat.   Eyes: Negative for double vision, photophobia, pain, discharge and redness.  Respiratory: Negative for cough, hemoptysis, sputum production, shortness of breath and wheezing.   Cardiovascular: Negative for chest pain, palpitations, orthopnea, claudication and leg swelling.  Gastrointestinal: Negative for abdominal pain, blood in stool, constipation, diarrhea, heartburn, melena, nausea and vomiting.  Genitourinary: Negative for dysuria, flank pain, frequency and hematuria.  Musculoskeletal: Negative for back pain, myalgias and neck pain.       Leg swelling  Skin: Negative for itching and rash.  Neurological: Negative for dizziness, tingling, tremors, focal weakness, weakness and headaches.  Endo/Heme/Allergies: Negative for environmental allergies. Bruises/bleeds easily.  Psychiatric/Behavioral: Negative for depression and hallucinations. The patient is not nervous/anxious.     MEDICAL HISTORY:  Past Medical History:  Diagnosis Date  . Arthritis    RHEUMATOID  . Dyspnea   . Genital herpes   . Hepatitis    HEP.C  . Hepatitis C   . Hypercholesteremia   . Hypertension   . [place]us ([place]  . [place] ([place]  .  Sjogren's syndrome ([place])     SURGICAL HISTORY: Past Surgical History:  Procedure Laterality  Date  . COLONOSCOPY    . COLONOSCOPY WITH PROPOFOL N/A 07/14/2017   Procedure: COLONOSCOPY WITH PROPOFOL;  Surgeon: Lollie Sails, MD;  Location: [facility] ENDOSCOPY;  Service: Endoscopy;  Laterality: N/A;  . ESOPHAGOGASTRODUODENOSCOPY (EGD) WITH PROPOFOL N/A 03/22/2018   Procedure: ESOPHAGOGASTRODUODENOSCOPY (EGD) WITH PROPOFOL;  Surgeon: Lollie Sails, MD;  Location: [facility] ENDOSCOPY;  Service: Endoscopy;  Laterality: N/A;  . HERNIA REPAIR Left   . INGUINAL HERNIA REPAIR Right 08/04/2017   Procedure: HERNIA REPAIR INGUINAL ADULT;  Surgeon: Leonie Green, MD;  Location: [facility] ORS;  Service: General;  Laterality: Right;  . KNEE ARTHROSCOPY      SOCIAL HISTORY: Social History   Socioeconomic History  . Marital status: Married    Spouse name: Not on file  . Number of children: Not on file  . Years of education: Not on file  . Highest education level: Not on file  Occupational History  . Not on file  Social Needs  . Financial resource strain: Not on file  . Food insecurity:    Worry: Patient refused    Inability: Patient refused  . Transportation needs:    Medical: Patient refused    Non-medical: Patient refused  Tobacco Use  . Smoking status: Former Smoker    Packs/day: 0.50    Last attempt to quit: 03/22/2018    Years since quitting: 0.3  . Smokeless tobacco: Never Used  Substance and Sexual Activity  . Alcohol use: No    Frequency: Never  . 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 Sexual activity: Not on file  Lifestyle  . Physical activity:    Days per week: Patient refused    Minutes per session: Patient refused  . Stress: Not on file  Relationships  . Social connections:    Talks on phone: Not on file    Gets together: Not on file    Attends religious service: Not on file    Active member of club or organization: Not on file    Attends meetings of clubs or organizations: Not on file    Relationship status: Not on file  . Intimate  partner violence:    Fear of current or ex partner: Not on file    Emotionally abused: Not on file    Physically abused: Not on file    Forced sexual activity: Not on file  Other Topics Concern  . Not on file  Social History Narrative  . Not on file    FAMILY HISTORY: History reviewed. No pertinent family history.  ALLERGIES:  is allergic to eggs or egg-derived products; penicillins; shellfish-derived products; and isovue [iopamidol].  MEDICATIONS:  Current Outpatient Medications  Medication Sig Dispense Refill  . amLODipine (NORVASC) 2.5 MG tablet Take 2.5 mg by mouth daily.    . diazepam (VALIUM) 5 MG tablet Take 5 mg by mouth 2 (two) times daily as needed.     . dorzolamide-timolol (COSOPT) 22.3-6.8 MG/ML ophthalmic solution INSTILL 1 DROP INTO BOTH EYES TWICE A DAY  6  . ferrous sulfate 325 (65 FE) MG EC tablet Take 1 tablet (325 mg total) by mouth 2 (two) times daily. 60 tablet 5  . latanoprost (XALATAN) 0.005 % ophthalmic solution Place 1 drop into both eyes at bedtime.  6  . lisinopril (PRINIVIL,ZESTRIL) 20 MG tablet Take 20 mg by mouth daily.    Marland Kitchen  predniSONE (DELTASONE) 10 MG tablet Take 10 mg by mouth daily with breakfast.    . sildenafil (VIAGRA) 100 MG tablet Take 100 mg by mouth daily as needed for erectile dysfunction.     No current facility-administered medications for this visit.      PHYSICAL EXAMINATION: ECOG PERFORMANCE STATUS: 1 - Symptomatic but completely ambulatory Vitals:   07/12/18 1431  BP: (!) 142/83  Pulse: 75  Resp: 18  Temp: (!) 97.3 F (36.3 C)  SpO2: 95%   Filed Weights   07/12/18 1431  Weight: 126 lb 11.2 oz (57.5 kg)    Physical Exam Constitutional:      General: He is not in acute distress.    Comments: thin  HENT:     Head: Normocephalic and atraumatic.  Eyes:     General: No scleral icterus.    Conjunctiva/sclera: Conjunctivae normal.     Pupils: Pupils are equal, round, and reactive to light.  Neck:     Musculoskeletal:  Normal range of motion and neck supple.  Cardiovascular:     Rate and Rhythm: Normal rate and regular rhythm.     Heart sounds: Normal heart sounds.  Pulmonary:     Effort: Pulmonary effort is normal. No respiratory distress.     Breath sounds: Normal breath sounds. No wheezing or rales.  Chest:     Chest wall: No tenderness.  Abdominal:     General: Bowel sounds are normal. There is no distension.     Palpations: Abdomen is soft. There is no mass.     Tenderness: There is no abdominal tenderness.  Musculoskeletal: Normal range of motion.        General: No deformity.  Lymphadenopathy:     Cervical: No cervical adenopathy.  Skin:    General: Skin is warm and dry.     Findings: No erythema or rash.     Comments: Small area of bruising/echymosis,of left upper extremity   Neurological:     Mental Status: He is alert and oriented to person, place, and time.     Cranial Nerves: No cranial nerve deficit.     Coordination: Coordination normal.  Psychiatric:        Behavior: Behavior normal.        Thought Content: Thought content normal.     Comments: Anxiety.      LABORATORY DATA:  I have reviewed the data as listed Lab Results  Component Value Date   WBC 5.1 07/12/2018   HGB 10.2 (L) 07/12/2018   HCT 35.1 (L) 07/12/2018   MCV 73.4 (L) 07/12/2018   PLT 72 (L) 07/12/2018   Recent Labs    01/13/18 1609 03/31/18 0919 06/11/18 1444 07/12/18 1412  NA 139  --   --  143  K 4.7  --   --  4.3  CL 102  --   --  105  CO2 28  --   --  30  GLUCOSE 117*  --   --  115*  BUN 25*  --   --  24*  CREATININE 0.73 0.70 0.80 0.75  CALCIUM 8.8*  --   --  9.1  GFRNONAA >60  --   --  >60  GFRAA >60  --   --  >60  PROT 8.5*  --   --  8.0  ALBUMIN 4.1  --   --  4.1  AST 18  --   --  15  ALT 8  --   --  9  ALKPHOS 50  --   --  50  BILITOT 1.2  --   --  0.9   Iron/TIBC/Ferritin/ %Sat    Component Value Date/Time   IRON 35 (L) 07/12/2018 1412   TIBC 304 07/12/2018 1412   FERRITIN  42 07/12/2018 1412   IRONPCTSAT 12 (L) 07/12/2018 1412    RADIOGRAPHIC STUDIES: I have personally reviewed the radiological images as listed and agreed with the findings in the report. 09/05/2016 2D echoL LVEF 55% 6/20/2017retroperitoneal [place]:  renal cysts, enlarged prostate.  07/29/2017 US abdomen: gallstone without acute cholecystitis, normal liver, spleen, pancrease. Renal cysts.  01/29/2018 CT chest abdomen pelvis.  1. No findings to explain the patient's symptoms. 2. Aortic atherosclerosis (ICD10-170.0). Coronary artery Calcification. 3.  Emphysema (ICD10-J43.9). 4. Cholelithiasis. 5. Probable simple or mildly complex renal cysts, difficult to definitively characterize without precontrast imaging. Lesions are previously evaluated by ultrasound on 07/29/2017.  Fibroscan demonstrated f2/f3 fibrosis  ASSESSMENT & PLAN:  1. MDS (myelodysplastic syndrome) ([place])   2. Iron deficiency anemia, unspecified iron deficiency anemia type   3. Thrombocytopenia ([place])   4. Monoclonal B-cell lymphocytosis    # MDS unclassifiable.due to MDS defining cytogenetics abnormality with +8, t(3:3)(q21;q26)  IPSS score 1, INT-1 IPSS R score 3.5, INT I have discussed with patient about the MDS diagnosis and recommend patient to obtain second opinion and also evaluation of bone marrow transplant. Patient declined.  For now his counts has been stably decreased, Anemia can also be secondary to iron deficiency, recommend observation with supportive care for now.   # Iron deficiency anemia Patient has received IV Venofer 266m x 3. Reports not feeling well after IV Venofer and does not want additional treatment. He has been taking oral iron supplements ferrous sulfate 3275mBID.  Tolerates well. Hemoglobin has not improved much.  Iron panel reviewed, ferritin level is 42, Iron saturation at 12, slightly improved.  Since he declined further IV iron treatment, continue oral ferrous sulfate 32522mID  #  Thrombocytopenia,  Chronically low counts. No changes since 3 months. Continue to monitor.   # Monoclonal lymphocytosis. Monitor.  # Vitamin B12 deficiency, last vitamin B12 level at 442.  Recommend patient to start vitamin B12 supplementation.   We spent sufficient time to discuss many aspect of care, questions were answered to patient's satisfaction.  Orders Placed This Encounter  Procedures  . CBC with Differential/Platelet    Standing Status:   Future    Standing Expiration Date:   07/13/2019  . Comprehensive metabolic panel    Standing Status:   Future    Standing Expiration Date:   07/13/2019  . Vitamin B12    Standing Status:   Future    Standing Expiration Date:   07/13/2019  . Anti-parietal antibody    Standing Status:   Future    Standing Expiration Date:   07/13/2019  . Intrinsic Factor Antibodies    Standing Status:   Future    Standing Expiration Date:   07/13/2019  . Iron and TIBC    Standing Status:   Future    Standing Expiration Date:   07/13/2019  . Ferritin    Standing Status:   Future    Standing Expiration Date:   07/13/2019   Follow up in 3 months.   ZhoEarlie ServerD, PhD Hematology Oncology [facility]- [id number]/2020

## 2018-07-13 ENCOUNTER — Ambulatory Visit: Payer: Medicare HMO | Admitting: Urology

## 2018-07-20 ENCOUNTER — Encounter: Payer: Self-pay | Admitting: Urology

## 2018-07-20 ENCOUNTER — Ambulatory Visit (INDEPENDENT_AMBULATORY_CARE_PROVIDER_SITE_OTHER): Payer: Medicare HMO | Admitting: Urology

## 2018-07-20 VITALS — BP 112/68 | HR 83 | Ht 68.0 in | Wt 123.0 lb

## 2018-07-20 DIAGNOSIS — R310 Gross hematuria: Secondary | ICD-10-CM | POA: Diagnosis not present

## 2018-07-20 LAB — URINALYSIS, COMPLETE
Bilirubin, UA: NEGATIVE
GLUCOSE, UA: NEGATIVE
KETONES UA: NEGATIVE
LEUKOCYTES UA: NEGATIVE
NITRITE UA: NEGATIVE
Protein, UA: NEGATIVE
SPEC GRAV UA: 1.02 (ref 1.005–1.030)
Urobilinogen, Ur: 2 mg/dL — ABNORMAL HIGH (ref 0.2–1.0)
pH, UA: 6.5 (ref 5.0–7.5)

## 2018-07-20 LAB — MICROSCOPIC EXAMINATION
Epithelial Cells (non renal): NONE SEEN /hpf (ref 0–10)
WBC UA: NONE SEEN /HPF (ref 0–5)

## 2018-07-20 LAB — BLADDER SCAN AMB NON-IMAGING

## 2018-07-20 MED ORDER — FINASTERIDE 5 MG PO TABS: 5.0000 mg | ORAL_TABLET | Freq: Every day | ORAL | 3 refills | Status: DC

## 2018-07-20 NOTE — Progress Notes (Signed)
   07/20/2018 1:47 PM   Juan Nguyen [birth year] [id number]  Reason for visit: Follow up gross hematuria  HPI: I saw Mr. Juan Nguyen back in urology clinic today for follow-up of gross hematuria.  He is a co-morbid 73 year old male with past medical history notable for MDS, pancytopenia, and lupus.  His wife is his primary caregiver as he has some confusion at baseline.  He originally presented in early November with 1 week of severe gross hematuria, however CT urogram was negative for any upper tract pathology, and prostate measured 41 cc.  On cystoscopy urine was cloudy, however no tumors were seen, and there was a median lobe of the prostate with mild oozing.  He reports his hematuria has since completely resolved and he is voiding clear yellow urine with a strong stream.  PSA in February 2019 was 2.4.  He overall is doing well without complaints.  In summary, the patient is a 73 year old co-morbid male with a week of severe hematuria in November 2019 that has since spontaneously resolved.  He is voiding clear yellow urine without issue.  CT urogram showed no upper tract or bladder pathology, and prostate was mildly enlarged at 41 cc.  Cystoscopy showed mild oozing at the bladder neck but no other bladder pathology.  -I recommended starting finasteride 5 mg daily to decrease risk of bleeding from friable prostate tissue -RTC 1 year with PVR, sooner if recurrent significant hematuria  A total of 10 minutes were spent face-to-face with the patient, greater than 50% was spent in patient education, counseling, and coordination of care regarding gross hematuria, BPH, and risks and benefits of finasteride.  Billey Co, [facility] [address], [place], [place] [address] [phone]

## 2018-10-12 DIAGNOSIS — J449 Chronic obstructive pulmonary disease, unspecified: Secondary | ICD-10-CM | POA: Insufficient documentation

## 2018-10-12 DIAGNOSIS — F0390 Unspecified dementia without behavioral disturbance: Secondary | ICD-10-CM | POA: Insufficient documentation

## 2018-10-14 ENCOUNTER — Inpatient Hospital Stay: Payer: Medicare HMO

## 2018-10-14 ENCOUNTER — Inpatient Hospital Stay: Payer: Medicare HMO | Admitting: Oncology

## 2019-01-05 ENCOUNTER — Other Ambulatory Visit: Payer: Self-pay

## 2019-01-05 ENCOUNTER — Emergency Department
Admission: EM | Admit: 2019-01-05 | Discharge: 2019-01-05 | Disposition: A | Discharge status: Home / Self Care | Payer: Medicare HMO | Attending: Student in an Organized Health Care Education/Training Program | Admitting: Student in an Organized Health Care Education/Training Program

## 2019-01-05 ENCOUNTER — Emergency Department: Payer: Medicare HMO

## 2019-01-05 DIAGNOSIS — R0609 Other forms of dyspnea: Secondary | ICD-10-CM | POA: Insufficient documentation

## 2019-01-05 DIAGNOSIS — J439 Emphysema, unspecified: Secondary | ICD-10-CM | POA: Insufficient documentation

## 2019-01-05 DIAGNOSIS — Z87891 Personal history of nicotine dependence: Secondary | ICD-10-CM | POA: Insufficient documentation

## 2019-01-05 DIAGNOSIS — I10 Essential (primary) hypertension: Secondary | ICD-10-CM | POA: Diagnosis not present

## 2019-01-05 DIAGNOSIS — Z20828 Contact with and (suspected) exposure to other viral communicable diseases: Secondary | ICD-10-CM | POA: Insufficient documentation

## 2019-01-05 DIAGNOSIS — R0602 Shortness of breath: Secondary | ICD-10-CM

## 2019-01-05 DIAGNOSIS — Z79899 Other long term (current) drug therapy: Secondary | ICD-10-CM | POA: Insufficient documentation

## 2019-01-05 DIAGNOSIS — R5382 Chronic fatigue, unspecified: Secondary | ICD-10-CM | POA: Insufficient documentation

## 2019-01-05 LAB — CBC WITH DIFFERENTIAL/PLATELET
Abs Immature Granulocytes: 0.04 10*3/uL (ref 0.00–0.07)
Basophils Absolute: 0 10*3/uL (ref 0.0–0.1)
Basophils Relative: 1 %
Eosinophils Absolute: 0.1 10*3/uL (ref 0.0–0.5)
Eosinophils Relative: 2 %
HCT: 37.4 % — ABNORMAL LOW (ref 39.0–52.0)
Hemoglobin: 11.2 g/dL — ABNORMAL LOW (ref 13.0–17.0)
Immature Granulocytes: 1 %
Lymphocytes Relative: 16 %
Lymphs Abs: 0.8 10*3/uL (ref 0.7–4.0)
MCH: 22.5 pg — ABNORMAL LOW (ref 26.0–34.0)
MCHC: 29.9 g/dL — ABNORMAL LOW (ref 30.0–36.0)
MCV: 75.3 fL — ABNORMAL LOW (ref 80.0–100.0)
Monocytes Absolute: 0.5 10*3/uL (ref 0.1–1.0)
Monocytes Relative: 10 %
Neutro Abs: 3.5 10*3/uL (ref 1.7–7.7)
Neutrophils Relative %: 70 %
Platelets: 88 10*3/uL — ABNORMAL LOW (ref 150–400)
RBC: 4.97 MIL/uL (ref 4.22–5.81)
RDW: 15.9 % — ABNORMAL HIGH (ref 11.5–15.5)
WBC: 5 10*3/uL (ref 4.0–10.5)
nRBC: 0 % (ref 0.0–0.2)

## 2019-01-05 LAB — COMPREHENSIVE METABOLIC PANEL
ALT: 9 U/L (ref 0–44)
AST: 16 U/L (ref 15–41)
Albumin: 4.1 g/dL (ref 3.5–5.0)
Alkaline Phosphatase: 65 U/L (ref 38–126)
Anion gap: 9 (ref 5–15)
BUN: 20 mg/dL (ref 8–23)
CO2: 26 mmol/L (ref 22–32)
Calcium: 8.6 mg/dL — ABNORMAL LOW (ref 8.9–10.3)
Chloride: 104 mmol/L (ref 98–111)
Creatinine, Ser: 0.92 mg/dL (ref 0.61–1.24)
GFR calc Af Amer: >60 mL/min (ref >60)
GFR calc non Af Amer: >60 mL/min (ref >60)
Glucose, Bld: 111 mg/dL — ABNORMAL HIGH (ref 70–99)
Potassium: 4.1 mmol/L (ref 3.5–5.1)
Sodium: 139 mmol/L (ref 135–145)
Total Bilirubin: 1.2 mg/dL (ref 0.3–1.2)
Total Protein: 7.7 g/dL (ref 6.5–8.1)

## 2019-01-05 LAB — BRAIN NATRIURETIC PEPTIDE: B Natriuretic Peptide: 127 pg/mL — ABNORMAL HIGH (ref 0.0–100.0)

## 2019-01-05 LAB — SARS CORONAVIRUS 2 BY RT PCR (HOSPITAL ORDER, PERFORMED IN ~~LOC~~ HOSPITAL LAB): SARS Coronavirus 2: NEGATIVE

## 2019-01-05 MED ORDER — DIPHENHYDRAMINE HCL 50 MG/ML IJ SOLN
25.0000 mg | Freq: Once | INTRAMUSCULAR | Status: AC
  Administered 2019-01-05: 25 mg via INTRAVENOUS
  Filled 2019-01-05: qty 1

## 2019-01-05 MED ORDER — IPRATROPIUM-ALBUTEROL 0.5-2.5 (3) MG/3ML IN SOLN
3.0000 mL | Freq: Once | RESPIRATORY_TRACT | Status: AC
  Administered 2019-01-05: 20:00:00 3 mL via RESPIRATORY_TRACT
  Filled 2019-01-05: qty 3

## 2019-01-05 MED ORDER — PREDNISONE 20 MG PO TABS: 40.0000 mg | ORAL_TABLET | Freq: Every day | ORAL | 0 refills | Status: AC

## 2019-01-05 MED ORDER — IOHEXOL 350 MG/ML SOLN
75.0000 mL | Freq: Once | INTRAVENOUS | Status: AC | PRN
  Administered 2019-01-05: 20:00:00 75 mL via INTRAVENOUS

## 2019-01-05 MED ORDER — METHYLPREDNISOLONE SODIUM SUCC 125 MG IJ SOLR
125.0000 mg | Freq: Once | INTRAMUSCULAR | Status: AC
  Administered 2019-01-05: 21:00:00 125 mg via INTRAVENOUS
  Filled 2019-01-05: qty 2

## 2019-01-05 MED ORDER — ALBUTEROL SULFATE HFA 108 (90 BASE) MCG/ACT IN AERS
2.0000 | INHALATION_SPRAY | Freq: Once | RESPIRATORY_TRACT | Status: AC
  Administered 2019-01-05: 2 via RESPIRATORY_TRACT
  Filled 2019-01-05: qty 6.7

## 2019-01-05 NOTE — ED Notes (Signed)
Pt asked multiple times by this RN to stay seated in wheelchair to avoid falling, pt continues to stand up and ambulate despite. Pt states that he feels "fine". Pt given warm blanket.

## 2019-01-05 NOTE — ED Triage Notes (Signed)
Pt was at PCP today for [facility] and desat with ambulating short distances (80's per nurse) - SHOB x6 months but worse today

## 2019-01-05 NOTE — ED Provider Notes (Signed)
[facility] [place] Emergency Department Provider Note    First MD Initiated Contact with Patient 01/05/19 1831     (approximate)  I have reviewed the triage vital signs and the nursing notes.   HISTORY  Chief Complaint Shortness of Breath    HPI Juan Nguyen is a 73 y.o. male presents the ER for evaluation of shortness of breath has been ongoing for the past 6 months becoming progressively worse.  Today patient denies any pain or pressure but does feel generalized weakness.  He does smoke.  Denies any fevers.  Denies any history of congestive heart failure.  Does have a history of bronchitis.  Not on any chronic nebulizers or inhalers.  Patient was seen in outpatient clinic today and found to be hypoxic with ambulation.  He is still short winded speaking in short phrases with tachypnea prolonged expiratory phase.    Past Medical History:  Diagnosis Date  . Arthritis    RHEUMATOID  . Dyspnea   . Genital herpes   . Hepatitis    HEP.C  . Hepatitis C   . Hypercholesteremia   . Hypertension   . Lupus ([place])   . Pancytopenia ([place])   . Sjogren's syndrome ([place])    No family history on file. Past Surgical History:  Procedure Laterality Date  . COLONOSCOPY    . COLONOSCOPY WITH PROPOFOL N/A 07/14/2017   Procedure: COLONOSCOPY WITH PROPOFOL;  Surgeon: Lollie Sails, MD;  Location: [facility] ENDOSCOPY;  Service: Endoscopy;  Laterality: N/A;  . ESOPHAGOGASTRODUODENOSCOPY (EGD) WITH PROPOFOL N/A 03/22/2018   Procedure: ESOPHAGOGASTRODUODENOSCOPY (EGD) WITH PROPOFOL;  Surgeon: Lollie Sails, MD;  Location: [facility] ENDOSCOPY;  Service: Endoscopy;  Laterality: N/A;  . HERNIA REPAIR Left   . INGUINAL HERNIA REPAIR Right 08/04/2017   Procedure: HERNIA REPAIR INGUINAL ADULT;  Surgeon: Leonie Green, MD;  Location: [facility] ORS;  Service: General;  Laterality: Right;  . KNEE ARTHROSCOPY     Patient Active Problem List   Diagnosis Date Noted  . Iron deficiency  anemia 01/14/2018      Prior to Admission medications   Medication Sig Start Date End Date Taking? Authorizing Provider  amLODipine (NORVASC) 2.5 MG tablet Take 2.5 mg by mouth daily.    [provider]  diazepam (VALIUM) 5 MG tablet Take 5 mg by mouth 2 (two) times daily as needed.     [provider]  dorzolamide-timolol (COSOPT) 22.3-6.8 MG/ML ophthalmic solution INSTILL 1 DROP INTO BOTH EYES TWICE A DAY 04/01/18   [provider]  ferrous sulfate 325 (65 FE) MG EC tablet Take 1 tablet (325 mg total) by mouth 2 (two) times daily. 07/12/18   Earlie Server, MD  finasteride (PROSCAR) 5 MG tablet Take 1 tablet (5 mg total) by mouth daily. 07/20/18   Billey Co, MD  latanoprost (XALATAN) 0.005 % ophthalmic solution Place 1 drop into both eyes at bedtime. 04/01/18   [provider]  lisinopril (PRINIVIL,ZESTRIL) 20 MG tablet Take 20 mg by mouth daily.    [provider]  predniSONE (DELTASONE) 10 MG tablet Take 10 mg by mouth daily with breakfast.    [provider]  predniSONE (DELTASONE) 20 MG tablet Take 2 tablets (40 mg total) by mouth daily for 5 days. 01/05/19 01/10/19  Merlyn Lot, MD  sildenafil (VIAGRA) 100 MG tablet Take 100 mg by mouth daily as needed for erectile dysfunction.    [provider]  vitamin B-12 (CYANOCOBALAMIN) 1000 MCG tablet Take  1 tablet (1,000 mcg total) by mouth daily. 07/12/18   Earlie Server, MD    Allergies Eggs or egg-derived products, Penicillins, and Shellfish-derived products    Social History Social History   Tobacco Use  . Smoking status: Former Smoker    Packs/day: 0.50    Quit date: 03/22/2018    Years since quitting: 0.7  . Smokeless tobacco: Never Used  Substance Use Topics  . Alcohol use: No    Frequency: Never  . Drug use: No    Comment: HX.OF IV DRUG USE AT YOUNG AGE    Review of Systems Patient denies headaches, rhinorrhea, blurry vision, numbness, shortness of breath, chest  pain, edema, cough, abdominal pain, nausea, vomiting, diarrhea, dysuria, fevers, rashes or hallucinations unless otherwise stated above in HPI. ____________________________________________   PHYSICAL EXAM:  VITAL SIGNS: Vitals:   01/05/19 2030 01/05/19 2142  BP: (!) 145/89 (!) 142/78  Pulse:  68  Resp:  14  Temp:  98.2 F (36.8 C)  SpO2:  94%    Constitutional: Alert and oriented.  Eyes: Conjunctivae are normal.  Head: Atraumatic. Nose: No congestion/rhinnorhea. Mouth/Throat: Mucous membranes are moist.   Neck: No stridor. Painless ROM.  Cardiovascular: Normal rate, regular rhythm. Grossly normal heart sounds.  Good peripheral circulation. Respiratory: tachypnea, prolonged expiratory phase, speaking in short phrases, diminished throughout Gastrointestinal: Soft and nontender. No distention. No abdominal bruits. No CVA tenderness. Genitourinary:  Musculoskeletal: No lower extremity tenderness nor edema.  No joint effusions. Neurologic:  Normal speech and language. No gross focal neurologic deficits are appreciated. No facial droop Skin:  Skin is warm, dry and intact. No rash noted. Psychiatric: Mood and affect are normal. Speech and behavior are normal.  ____________________________________________   LABS (all labs ordered are listed, but only abnormal results are displayed)  Results for orders placed or performed during the hospital encounter of 01/05/19 (from the past 24 hour(s))  CBC with Differential     Status: Abnormal   Collection Time: 01/05/19  2:41 PM  Result Value Ref Range   WBC 5.0 4.0 - 10.5 K/uL   RBC 4.97 4.22 - 5.81 MIL/uL   Hemoglobin 11.2 (L) 13.0 - 17.0 g/dL   HCT 37.4 (L) 39.0 - 52.0 %   MCV 75.3 (L) 80.0 - 100.0 fL   MCH 22.5 (L) 26.0 - 34.0 pg   MCHC 29.9 (L) 30.0 - 36.0 g/dL   RDW 15.9 (H) 11.5 - 15.5 %   Platelets 88 (L) 150 - 400 K/uL   nRBC 0.0 0.0 - 0.2 %   Neutrophils Relative % 70 %   Neutro Abs 3.5 1.7 - 7.7 K/uL   Lymphocytes  Relative 16 %   Lymphs Abs 0.8 0.7 - 4.0 K/uL   Monocytes Relative 10 %   Monocytes Absolute 0.5 0.1 - 1.0 K/uL   Eosinophils Relative 2 %   Eosinophils Absolute 0.1 0.0 - 0.5 K/uL   Basophils Relative 1 %   Basophils Absolute 0.0 0.0 - 0.1 K/uL   Immature Granulocytes 1 %   Abs Immature Granulocytes 0.04 0.00 - 0.07 K/uL  Comprehensive metabolic panel     Status: Abnormal   Collection Time: 01/05/19  2:41 PM  Result Value Ref Range   Sodium 139 135 - 145 mmol/L   Potassium 4.1 3.5 - 5.1 mmol/L   Chloride 104 98 - 111 mmol/L   CO2 26 22 - 32 mmol/L   Glucose, Bld 111 (H) 70 - 99 mg/dL   BUN 20 8 -  23 mg/dL   Creatinine, Ser 0.92 0.61 - 1.24 mg/dL   Calcium 8.6 (L) 8.9 - 10.3 mg/dL   Total Protein 7.7 6.5 - 8.1 g/dL   Albumin 4.1 3.5 - 5.0 g/dL   AST 16 15 - 41 U/L   ALT 9 0 - 44 U/L   Alkaline Phosphatase 65 38 - 126 U/L   Total Bilirubin 1.2 0.3 - 1.2 mg/dL   GFR calc non Af Amer >60 >60 mL/min   GFR calc Af Amer >60 >60 mL/min   Anion gap 9 5 - 15  Brain natriuretic peptide     Status: Abnormal   Collection Time: 01/05/19  2:48 PM  Result Value Ref Range   B Natriuretic Peptide 127.0 (H) 0.0 - 100.0 pg/mL  SARS Coronavirus 2 (CEPHEID- Performed in [place] hospital lab), Hosp Order     Status: None   Collection Time: 01/05/19  6:38 PM   Specimen: Nasopharyngeal Swab  Result Value Ref Range   SARS Coronavirus 2 NEGATIVE NEGATIVE   ____________________________________________  EKG My review and personal interpretation at Time: 14:54    Indication: sob  Rate: 74  Rhythm: sinus Axis: normal Other:  Normal intervals, no stemi ____________________________________________  RADIOLOGY  I personally reviewed all radiographic images ordered to evaluate for the above acute complaints and reviewed radiology reports and findings.  These findings were personally discussed with the patient.  Please see medical record for radiology report.   ____________________________________________   PROCEDURES  Procedure(s) performed:  Procedures    Critical Care performed: no ____________________________________________   INITIAL IMPRESSION / ASSESSMENT AND PLAN / ED COURSE  Pertinent labs & imaging results that were available during my care of the patient were reviewed by me and considered in my medical decision making (see chart for details).   DDX: Asthma, copd, CHF, pna, ptx, malignancy, Pe, anemia   Juan Nguyen is a 73 y.o. who presents to the ED with was as described above.  Patient does have some wheeze on exam but is not in any respiratory distress mildly tachypneic.  No hypoxia at rest.  Will give nebs and reassess.  Clinical Course as of Jan 04 2329  Wed Jan 05, 2019  2101 Patient with significant improvement in breathing after nebulizer treatments.  No evidence of PE.  Will give steroids.   [PR]  2126 Patient was able to ambulate without any hypoxia.  No shortness of breath or discomfort at this time.  Does have obvious chronic lung disease and emphysema via CT. no evidence of infectious process.   [PR]  2144 Patient remains without any hypoxia.  Will be given inhaler as well as prescription for steroids.  Will give referral to pulmonology.  Discussed signs and symptoms for which he should return to the ER.   [PR]    Clinical Course User Index [PR] Merlyn Lot, MD     As part of my medical decision making, I reviewed the following data within the [place] notes reviewed and incorporated, Labs reviewed, notes from prior ED visits and [place] Controlled Substance Database   ____________________________________________   FINAL CLINICAL IMPRESSION(S) / ED DIAGNOSES  Final diagnoses:  SOB (shortness of breath)  Pulmonary emphysema, unspecified emphysema type ([place])      NEW MEDICATIONS STARTED DURING THIS VISIT:  Discharge Medication List as of 01/05/2019  9:46 PM     START taking these medications   Details  !! predniSONE (DELTASONE) 20 MG tablet Take 2  tablets (40 mg total) by mouth daily for 5 days., Starting Wed 01/05/2019, Until Mon 01/10/2019, Normal     !! - Potential duplicate medications found. Please discuss with provider.       Note:  This document was prepared using Dragon voice recognition software and may include unintentional dictation errors.    Merlyn Lot, MD 01/05/19 2330

## 2019-01-05 NOTE — ED Notes (Signed)
Pt was standing in the hallway, this EDT assisted pt to the bed and gave him a meal tray. This EDT also walked the pt while monitoring his O2 stats and reported observations to Dr. Quentin Cornwall.

## 2019-01-05 NOTE — ED Triage Notes (Signed)
First RN Note: Pt presents to ED via wheelchair from [facility] due to SOB and ambulatory sats dropping to 88%.

## 2019-01-05 NOTE — ED Notes (Signed)
Patient transported to CT 

## 2019-01-05 NOTE — ED Notes (Signed)
Pt to the desk to ask "what the deal is with his wife not being able to come back". This RN explained that due to Covid restrictions, no visitors. Pt states "so what if I decide to leave because of it?" This RN explained that leaving AMA would not be advisable due to his desat with ambulation per [facility], pt states "okay" then goes and sits back in wheelchair. Some increased work of breathing noted with ambulation.

## 2019-01-11 ENCOUNTER — Other Ambulatory Visit: Payer: Self-pay

## 2019-01-12 ENCOUNTER — Inpatient Hospital Stay: Payer: Medicare HMO | Attending: Oncology

## 2019-01-12 ENCOUNTER — Inpatient Hospital Stay (HOSPITAL_BASED_OUTPATIENT_CLINIC_OR_DEPARTMENT_OTHER): Payer: Medicare HMO | Admitting: Oncology

## 2019-01-12 ENCOUNTER — Other Ambulatory Visit: Payer: Self-pay

## 2019-01-12 ENCOUNTER — Encounter: Payer: Self-pay | Admitting: Oncology

## 2019-01-12 VITALS — BP 127/84 | HR 90 | Temp 99.2°F | Resp 18

## 2019-01-12 DIAGNOSIS — Z79899 Other long term (current) drug therapy: Secondary | ICD-10-CM | POA: Diagnosis not present

## 2019-01-12 DIAGNOSIS — D469 Myelodysplastic syndrome, unspecified: Secondary | ICD-10-CM

## 2019-01-12 DIAGNOSIS — E538 Deficiency of other specified B group vitamins: Secondary | ICD-10-CM | POA: Insufficient documentation

## 2019-01-12 DIAGNOSIS — J439 Emphysema, unspecified: Secondary | ICD-10-CM

## 2019-01-12 DIAGNOSIS — D509 Iron deficiency anemia, unspecified: Secondary | ICD-10-CM

## 2019-01-12 DIAGNOSIS — D72820 Lymphocytosis (symptomatic): Secondary | ICD-10-CM

## 2019-01-12 DIAGNOSIS — D696 Thrombocytopenia, unspecified: Secondary | ICD-10-CM

## 2019-01-12 DIAGNOSIS — D61818 Other pancytopenia: Secondary | ICD-10-CM

## 2019-01-12 LAB — CBC WITH DIFFERENTIAL/PLATELET
Abs Immature Granulocytes: 0.25 10*3/uL — ABNORMAL HIGH (ref 0.00–0.07)
Basophils Absolute: 0 10*3/uL (ref 0.0–0.1)
Basophils Relative: 1 %
Eosinophils Absolute: 0 10*3/uL (ref 0.0–0.5)
Eosinophils Relative: 0 %
HCT: 36.8 % — ABNORMAL LOW (ref 39.0–52.0)
Hemoglobin: 11.3 g/dL — ABNORMAL LOW (ref 13.0–17.0)
Immature Granulocytes: 6 %
Lymphocytes Relative: 12 %
Lymphs Abs: 0.5 10*3/uL — ABNORMAL LOW (ref 0.7–4.0)
MCH: 22.7 pg — ABNORMAL LOW (ref 26.0–34.0)
MCHC: 30.7 g/dL (ref 30.0–36.0)
MCV: 74 fL — ABNORMAL LOW (ref 80.0–100.0)
Monocytes Absolute: 0.1 10*3/uL (ref 0.1–1.0)
Monocytes Relative: 2 %
Neutro Abs: 3.2 10*3/uL (ref 1.7–7.7)
Neutrophils Relative %: 79 %
Platelets: 93 10*3/uL — ABNORMAL LOW (ref 150–400)
RBC: 4.97 MIL/uL (ref 4.22–5.81)
RDW: 16.2 % — ABNORMAL HIGH (ref 11.5–15.5)
Smear Review: NORMAL
WBC: 4.1 10*3/uL (ref 4.0–10.5)
nRBC: 0 % (ref 0.0–0.2)

## 2019-01-12 LAB — COMPREHENSIVE METABOLIC PANEL
ALT: 10 U/L (ref 0–44)
AST: 19 U/L (ref 15–41)
Albumin: 4.2 g/dL (ref 3.5–5.0)
Alkaline Phosphatase: 66 U/L (ref 38–126)
Anion gap: 10 (ref 5–15)
BUN: 23 mg/dL (ref 8–23)
CO2: 25 mmol/L (ref 22–32)
Calcium: 8.6 mg/dL — ABNORMAL LOW (ref 8.9–10.3)
Chloride: 103 mmol/L (ref 98–111)
Creatinine, Ser: 0.76 mg/dL (ref 0.61–1.24)
GFR calc Af Amer: >60 mL/min (ref >60)
GFR calc non Af Amer: >60 mL/min (ref >60)
Glucose, Bld: 197 mg/dL — ABNORMAL HIGH (ref 70–99)
Potassium: 4.1 mmol/L (ref 3.5–5.1)
Sodium: 138 mmol/L (ref 135–145)
Total Bilirubin: 1 mg/dL (ref 0.3–1.2)
Total Protein: 7.7 g/dL (ref 6.5–8.1)

## 2019-01-12 LAB — IRON AND TIBC
Iron: 32 ug/dL — ABNORMAL LOW (ref 45–182)
Saturation Ratios: 11 % — ABNORMAL LOW (ref 17.9–39.5)
TIBC: 285 ug/dL (ref 250–450)
UIBC: 253 ug/dL

## 2019-01-12 LAB — FERRITIN: Ferritin: 87 ng/mL (ref 24–336)

## 2019-01-12 LAB — VITAMIN B12: Vitamin B-12: 1202 pg/mL — ABNORMAL HIGH (ref 180–914)

## 2019-01-12 NOTE — Progress Notes (Signed)
Patient here today for follow up.  Patient c/o SOB with exertion.

## 2019-01-13 LAB — INTRINSIC FACTOR ANTIBODIES: Intrinsic Factor: 1 AU/mL (ref 0.0–1.1)

## 2019-01-13 LAB — ANTI-PARIETAL ANTIBODY: Parietal Cell Antibody-IgG: 2.7 Units (ref 0.0–20.0)

## 2019-01-14 NOTE — Progress Notes (Signed)
Hematology/Oncology follow up Note [facility] [phone] [phone]   Patient Care Team: Kirk Ruths, MD as PCP - General (Internal Medicine)  REFERRING PROVIDER: Waylan Rocher REASON FOR VISIT Follow up for mangement of MDS, iron deficiency anemia and vitamin B12 deficiency  HISTORY OF PRESENTING ILLNESS:  Juan Nguyen is a  73 y.o.  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 Patient has been pancytopenic chronically, previous hematology evaluation was obtained remotely, with a bone marrow biopsy.  Patient has multiple chronic problems and follows up mainly at [place]. Extensive chart review of medical charts in [facility] through care everywhere was performed by me. #Rheumatology problem: Systemic lupus was diagnosed in 2006, patient was started on Imuran in 2007 and discontinued in 2008 due to leukopenia.  He was also on Plaquenil which was discontinued 2007 and discontinued due to cost issue To establish care with [facility] currently on prednisone  Patient also had chronic hepatitis C history, status post Ribavirin interferon alfa treatment.  Hepatitis viral load undetectable last tested in January 2019. #Chronic bilateral lower extremity swelling, no aggravating or alleviating factors. Weight loss, chronic problem for patient.  Reports appetite is continued to have weight loss  #Last colonoscopy in January 2019 which showed diverticulosis and colon polyps. #Easy bruising: Chronic, waxing and waning.  Patient showed me an area of severe bruising after a minor trauma of his left elbow. Denies hematochezia, hematuria, # hematemesis, epistaxis, black tarry stool.    # # S/p IV venofer weekly x 3. Complained about lower extremity swelling after each Venofer dose.  # B12 deficiency, s/p Weekly B12 1068mg x 3 doses.  # Dr.K[facility]le. plaquenil stopped and patient is low dose  prednisone 165mdaily.  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  [name]. male who has above history reviewed by me today presents for follow-up of MDS. Per patient's request, I called patient's wife and per her on speaker so she can hear anti-conversation and also participated in discussion. Reports shortness of breath progressively getting worse for the past 6 months. Patient had a ED visit on 01/05/2019 due to shortness of breath.  patient was provided nebulizer treatment.  With symptom improved.  No hypoxia.  Patient was given prescription of steroids and released home.  Patient was referred to pulmonology. Recently seen by primary care physician Dr. AnOuida SillsNote reviewed.  Patient is on prednisone 10 mg daily as maintenance.  Valium 5 mg every 12 hours as needed.  Today patient denies any chest pain, wheezing.  Chronic shortness of breath, slightly better after started on prednisone.  Shortness of breath is exacerbated with speaking and exertion. Continue to feel fatigued.  Otherwise doing well.  Easy bruising.  No history of active bleeding.    Review of Systems  Constitutional: Positive for fatigue. Negative for appetite change, chills, fever and unexpected weight change.  HENT:   Negative for hearing loss and voice change.   Eyes: Negative for eye problems and icterus.  Respiratory: Positive for shortness of breath. Negative for chest tightness and cough.   Cardiovascular: Negative for chest pain.  Gastrointestinal: Negative for abdominal distention and abdominal pain.  Endocrine: Negative for hot flashes.  Genitourinary: Negative for difficulty urinating, dysuria and frequency.   Musculoskeletal: Negative for arthralgias.  Skin: Negative for itching  and rash.  Neurological: Negative for light-headedness and numbness.  Hematological:  Negative for adenopathy. Bruises/bleeds easily.  Psychiatric/Behavioral: Negative for confusion.     MEDICAL HISTORY:  Past Medical History:  Diagnosis Date  . Arthritis    RHEUMATOID  . Dyspnea   . Genital herpes   . Hepatitis    HEP.C  . Hepatitis C   . Hypercholesteremia   . Hypertension   . Lupus ([place])   . Pancytopenia ([place])   . Sjogren's syndrome ([place])     SURGICAL HISTORY: Past Surgical History:  Procedure Laterality Date  . COLONOSCOPY    . COLONOSCOPY WITH PROPOFOL N/A 07/14/2017   Procedure: COLONOSCOPY WITH PROPOFOL;  Surgeon: Lollie Sails, MD;  Location: [facility] ENDOSCOPY;  Service: Endoscopy;  Laterality: N/A;  . ESOPHAGOGASTRODUODENOSCOPY (EGD) WITH PROPOFOL N/A 03/22/2018   Procedure: ESOPHAGOGASTRODUODENOSCOPY (EGD) WITH PROPOFOL;  Surgeon: Lollie Sails, MD;  Location: [facility] ENDOSCOPY;  Service: Endoscopy;  Laterality: N/A;  . HERNIA REPAIR Left   . INGUINAL HERNIA REPAIR Right 08/04/2017   Procedure: HERNIA REPAIR INGUINAL ADULT;  Surgeon: Leonie Green, MD;  Location: [facility] ORS;  Service: General;  Laterality: Right;  . KNEE ARTHROSCOPY      SOCIAL HISTORY: Social History   Socioeconomic History  . Marital status: Married    Spouse name: Not on file  . Number of children: Not on file  . Years of education: Not on file  . Highest education level: Not on file  Occupational History  . Not on file  Social Needs  . Financial resource strain: Not on file  . Food insecurity    Worry: Patient refused    Inability: Patient refused  . Transportation needs    Medical: Patient refused    Non-medical: Patient refused  Tobacco Use  . Smoking status: Former Smoker    Packs/day: 0.50    Quit date: 03/22/2018    Years since quitting: 0.8  . Smokeless tobacco: Never Used  Substance and Sexual Activity  . Alcohol use: No    Frequency: Never  . 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 Sexual activity: Not on file  Lifestyle   . Physical activity    Days per week: Patient refused    Minutes per session: Patient refused  . Stress: Not on file  Relationships  . Social Herbalist on phone: Not on file    Gets together: Not on file    Attends religious service: Not on file    Active member of club or organization: Not on file    Attends meetings of clubs or organizations: Not on file    Relationship status: Not on file  . Intimate partner violence    Fear of current or ex partner: Not on file    Emotionally abused: Not on file    Physically abused: Not on file    Forced sexual activity: Not on file  Other Topics Concern  . Not on file  Social History Narrative  . Not on file    FAMILY HISTORY: History reviewed. No pertinent family history.  ALLERGIES:  is allergic to eggs or egg-derived products; penicillins; and shellfish-derived products.  MEDICATIONS:  Current Outpatient Medications  Medication Sig Dispense Refill  . amLODipine (NORVASC) 2.5 MG tablet Take 2.5 mg by mouth daily.    . diazepam (VALIUM) 5 MG tablet Take 5 mg by mouth 2 (two) times daily as needed.     Marland Kitchen  dorzolamide-timolol (COSOPT) 22.3-6.8 MG/ML ophthalmic solution INSTILL 1 DROP INTO BOTH EYES TWICE A DAY  6  . ferrous sulfate 325 (65 FE) MG EC tablet Take 1 tablet (325 mg total) by mouth 2 (two) times daily. 60 tablet 5  . finasteride (PROSCAR) 5 MG tablet Take 1 tablet (5 mg total) by mouth daily. 90 tablet 3  . latanoprost (XALATAN) 0.005 % ophthalmic solution Place 1 drop into both eyes at bedtime.  6  . lisinopril (PRINIVIL,ZESTRIL) 20 MG tablet Take 20 mg by mouth daily.    . predniSONE (DELTASONE) 10 MG tablet Take 10 mg by mouth daily with breakfast.    . sildenafil (VIAGRA) 100 MG tablet Take 100 mg by mouth daily as needed for erectile dysfunction.    . vitamin B-12 (CYANOCOBALAMIN) 1000 MCG tablet Take 1 tablet (1,000 mcg total) by mouth daily. 90 tablet 3   No current facility-administered medications for  this visit.      PHYSICAL EXAMINATION: ECOG PERFORMANCE STATUS: 1 - Symptomatic but completely ambulatory Vitals:   01/12/19 1343  BP: 127/84  Pulse: 90  Resp: 18  Temp: 99.2 F (37.3 C)   There were no vitals filed for this visit.  Physical Exam Constitutional:      General: He is not in acute distress.    Appearance: He is ill-appearing.     Comments: Thin, walk in   HENT:     Head: Normocephalic and atraumatic.  Eyes:     General: No scleral icterus.    Conjunctiva/sclera: Conjunctivae normal.     Pupils: Pupils are equal, round, and reactive to light.  Neck:     Musculoskeletal: Normal range of motion and neck supple.  Cardiovascular:     Rate and Rhythm: Normal rate and regular rhythm.     Heart sounds: Normal heart sounds.  Pulmonary:     Effort: Pulmonary effort is normal. No respiratory distress.     Breath sounds: Normal breath sounds. No wheezing or rales.  Chest:     Chest wall: No tenderness.  Abdominal:     General: Bowel sounds are normal. There is no distension.     Palpations: Abdomen is soft. There is no mass.     Tenderness: There is no abdominal tenderness.  Musculoskeletal: Normal range of motion.        General: No deformity.  Lymphadenopathy:     Cervical: No cervical adenopathy.  Skin:    General: Skin is warm and dry.     Findings: No erythema or rash.     Comments: Multiple area of bruising/echymosis of bilateral upper extremity.  Neurological:     Mental Status: He is alert and oriented to person, place, and time.     Cranial Nerves: No cranial nerve deficit.     Coordination: Coordination normal.  Psychiatric:     Comments: Anxiety.      LABORATORY DATA:  I have reviewed the data as listed Lab Results  Component Value Date   WBC 4.1 01/12/2019   HGB 11.3 (L) 01/12/2019   HCT 36.8 (L) 01/12/2019   MCV 74.0 (L) 01/12/2019   PLT 93 (L) 01/12/2019   Recent Labs    07/12/18 1412 01/05/19 1441 01/12/19 1250  NA 143 139 138   K 4.3 4.1 4.1  CL 105 104 103  CO2 30 26 25   GLUCOSE 115* 111* 197*  BUN 24* 20 23  CREATININE 0.75 0.92 0.76  CALCIUM 9.1 8.6* 8.6*  GFRNONAA >60 >60 >60  GFRAA >60 >60 >60  PROT 8.0 7.7 7.7  ALBUMIN 4.1 4.1 4.2  AST 15 16 19   ALT 9 9 10   ALKPHOS 50 65 66  BILITOT 0.9 1.2 1.0   Iron/TIBC/Ferritin/ %Sat    Component Value Date/Time   IRON 32 (L) 01/12/2019 1250   TIBC 285 01/12/2019 1250   FERRITIN 87 01/12/2019 1250   IRONPCTSAT 11 (L) 01/12/2019 1250   RADIOGRAPHIC STUDIES: I have personally reviewed the radiological images as listed and agreed with the findings in the report. 09/05/2016 2D echoL LVEF 55% 6/20/2017retroperitoneal [place]:  renal cysts, enlarged prostate.  07/29/2017 US abdomen: gallstone without acute cholecystitis, normal liver, spleen, pancrease. Renal cysts.  01/29/2018 CT chest abdomen pelvis.  1. No findings to explain the patient's symptoms. 2. Aortic atherosclerosis (ICD10-170.0). Coronary artery Calcification. 3.  Emphysema (ICD10-J43.9). 4. Cholelithiasis. 5. Probable simple or mildly complex renal cysts, difficult to definitively characterize without precontrast imaging. Lesions are previously evaluated by ultrasound on 07/29/2017.  Fibroscan demonstrated f2/f3 fibrosis  #01/05/2019 CT angiogram chest PE protocol No evidence of acute pulmonary embolism.  Chronic lung disease with emphysema.  New complex fluid within a pre-existing 21 mm right lower lobe lung cyst.  Probably probably inflammatory rather than acute pulmonary abscess.  Chronic T4 compression fracture.  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 Monoclonal B-cell lymphocytosis   3. Iron deficiency anemia, unspecified iron deficiency anemia type   4. Thrombocytopenia ([place])   5. Pulmonary emphysema, unspecified emphysema type ([place])    # MDS unclassifiable.due to MDS defining cytogenetics abnormality with +8, t(3:3)(q21;q26)  IPSS score 1, INT-1  IPSS R score 3.5, INT Labs are discussed with patient in the clinic and wife over the phone. Pancytopenia is stable.  Hemoglobin 11.3.  Platelet counts 93,000.  No active bleeding events. Continue monitor.  Decision has been made previously to continue observation.  Patient declined bone marrow biopsy.  #Iron deficiency anemia, patient previously received IV Venofer.  Reports not tolerating. Continue oral iron supplementation ferrous sulfate 325 mg twice daily. Iron panel was reviewed.  Ferritin level 87.  Iron saturation 11.  # Thrombocytopenia, stable.  Continue to monitor.  # Monoclonal lymphocytosis.  Continue to monitor. # Vitamin B12 deficiency, vitamin B12 level has improved.  Negative antiparietal antibody and anti-intrinsic factor antibodies. #Emphysema, follow-up with pulmonology for further management. We spent sufficient time to discuss many aspect of care, questions were answered to patient's satisfaction.  Orders Placed This Encounter  Procedures  . CBC with Differential/Platelet    Standing Status:   Future    Standing Expiration Date:   01/12/2020  . Comprehensive metabolic panel    Standing Status:   Future    Standing Expiration Date:   01/12/2020  . Vitamin B12    Standing Status:   Future    Standing Expiration Date:   07/15/2019  . Anti-parietal antibody    Standing Status:   Future    Standing Expiration Date:   01/12/2020  . Intrinsic Factor Antibodies    Standing Status:   Future    Standing Expiration Date:   01/12/2020  . Iron and TIBC    Standing Status:   Future    Standing Expiration Date:   01/12/2020  . Ferritin    Standing Status:   Future    Standing Expiration Date:   01/12/2020   Follow up in 3 months Earlie Server, MD, PhD

## 2019-01-20 DIAGNOSIS — Z7952 Long term (current) use of systemic steroids: Secondary | ICD-10-CM | POA: Insufficient documentation

## 2019-04-12 ENCOUNTER — Encounter: Payer: Self-pay | Admitting: Oncology

## 2019-04-12 ENCOUNTER — Other Ambulatory Visit: Payer: Self-pay

## 2019-04-12 NOTE — Progress Notes (Signed)
Patient stated that he would want his wife-Roberta [phone] to be called when the physician is in the room.

## 2019-04-13 ENCOUNTER — Other Ambulatory Visit: Payer: Self-pay

## 2019-04-13 ENCOUNTER — Inpatient Hospital Stay: Payer: Medicare HMO | Attending: Oncology

## 2019-04-13 ENCOUNTER — Other Ambulatory Visit: Payer: Self-pay | Admitting: *Deleted

## 2019-04-13 ENCOUNTER — Inpatient Hospital Stay (HOSPITAL_BASED_OUTPATIENT_CLINIC_OR_DEPARTMENT_OTHER): Payer: Medicare HMO | Admitting: Oncology

## 2019-04-13 VITALS — BP 158/91 | HR 73 | Temp 97.7°F | Resp 16 | Wt 127.2 lb

## 2019-04-13 DIAGNOSIS — J439 Emphysema, unspecified: Secondary | ICD-10-CM

## 2019-04-13 DIAGNOSIS — I10 Essential (primary) hypertension: Secondary | ICD-10-CM | POA: Insufficient documentation

## 2019-04-13 DIAGNOSIS — M329 Systemic lupus erythematosus, unspecified: Secondary | ICD-10-CM

## 2019-04-13 DIAGNOSIS — E7800 Pure hypercholesterolemia, unspecified: Secondary | ICD-10-CM | POA: Diagnosis not present

## 2019-04-13 DIAGNOSIS — D469 Myelodysplastic syndrome, unspecified: Secondary | ICD-10-CM

## 2019-04-13 DIAGNOSIS — D696 Thrombocytopenia, unspecified: Secondary | ICD-10-CM | POA: Insufficient documentation

## 2019-04-13 DIAGNOSIS — D72820 Lymphocytosis (symptomatic): Secondary | ICD-10-CM

## 2019-04-13 DIAGNOSIS — M1990 Unspecified osteoarthritis, unspecified site: Secondary | ICD-10-CM | POA: Insufficient documentation

## 2019-04-13 DIAGNOSIS — B182 Chronic viral hepatitis C: Secondary | ICD-10-CM | POA: Diagnosis not present

## 2019-04-13 DIAGNOSIS — D509 Iron deficiency anemia, unspecified: Secondary | ICD-10-CM

## 2019-04-13 DIAGNOSIS — E538 Deficiency of other specified B group vitamins: Secondary | ICD-10-CM | POA: Insufficient documentation

## 2019-04-13 DIAGNOSIS — Z79899 Other long term (current) drug therapy: Secondary | ICD-10-CM | POA: Diagnosis not present

## 2019-04-13 LAB — CBC WITH DIFFERENTIAL/PLATELET
Abs Immature Granulocytes: 0.11 10*3/uL — ABNORMAL HIGH (ref 0.00–0.07)
Basophils Absolute: 0 10*3/uL (ref 0.0–0.1)
Basophils Relative: 0 %
Eosinophils Absolute: 0 10*3/uL (ref 0.0–0.5)
Eosinophils Relative: 0 %
HCT: 40.5 % (ref 39.0–52.0)
Hemoglobin: 12.2 g/dL — ABNORMAL LOW (ref 13.0–17.0)
Immature Granulocytes: 2 %
Lymphocytes Relative: 8 %
Lymphs Abs: 0.6 10*3/uL — ABNORMAL LOW (ref 0.7–4.0)
MCH: 23.2 pg — ABNORMAL LOW (ref 26.0–34.0)
MCHC: 30.1 g/dL (ref 30.0–36.0)
MCV: 77.1 fL — ABNORMAL LOW (ref 80.0–100.0)
Monocytes Absolute: 0.2 10*3/uL (ref 0.1–1.0)
Monocytes Relative: 2 %
Neutro Abs: 6.5 10*3/uL (ref 1.7–7.7)
Neutrophils Relative %: 88 %
Platelets: 53 10*3/uL — ABNORMAL LOW (ref 150–400)
RBC: 5.25 MIL/uL (ref 4.22–5.81)
RDW: 17.2 % — ABNORMAL HIGH (ref 11.5–15.5)
WBC: 7.4 10*3/uL (ref 4.0–10.5)
nRBC: 0 % (ref 0.0–0.2)

## 2019-04-13 LAB — IRON AND TIBC
Iron: 103 ug/dL (ref 45–182)
Saturation Ratios: 31 % (ref 17.9–39.5)
TIBC: 330 ug/dL (ref 250–450)
UIBC: 227 ug/dL

## 2019-04-13 LAB — COMPREHENSIVE METABOLIC PANEL
ALT: 11 U/L (ref 0–44)
AST: 19 U/L (ref 15–41)
Albumin: 4.9 g/dL (ref 3.5–5.0)
Alkaline Phosphatase: 56 U/L (ref 38–126)
Anion gap: 9 (ref 5–15)
BUN: 27 mg/dL — ABNORMAL HIGH (ref 8–23)
CO2: 25 mmol/L (ref 22–32)
Calcium: 9.4 mg/dL (ref 8.9–10.3)
Chloride: 103 mmol/L (ref 98–111)
Creatinine, Ser: 0.82 mg/dL (ref 0.61–1.24)
GFR calc Af Amer: >60 mL/min (ref >60)
GFR calc non Af Amer: >60 mL/min (ref >60)
Glucose, Bld: 110 mg/dL — ABNORMAL HIGH (ref 70–99)
Potassium: 3.7 mmol/L (ref 3.5–5.1)
Sodium: 137 mmol/L (ref 135–145)
Total Bilirubin: 1.6 mg/dL — ABNORMAL HIGH (ref 0.3–1.2)
Total Protein: 8.4 g/dL — ABNORMAL HIGH (ref 6.5–8.1)

## 2019-04-13 LAB — VITAMIN B12: Vitamin B-12: 1076 pg/mL — ABNORMAL HIGH (ref 180–914)

## 2019-04-13 LAB — TECHNOLOGIST SMEAR REVIEW: Plt Morphology: NORMAL

## 2019-04-13 LAB — IMMATURE PLATELET FRACTION: Immature Platelet Fraction: 1.5 % (ref 1.2–8.6)

## 2019-04-13 LAB — FERRITIN: Ferritin: 67 ng/mL (ref 24–336)

## 2019-04-13 NOTE — Progress Notes (Signed)
Hematology/Oncology follow up Note [facility] [phone] [phone]   Patient Care Team: Kirk Ruths, MD as PCP - General (Internal Medicine)  REFERRING PROVIDER: Waylan Rocher REASON FOR VISIT Follow up for mangement of MDS, iron deficiency anemia and vitamin B12 deficiency  HISTORY OF PRESENTING ILLNESS:  Juan Nguyen is a  73 y.o.  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 Patient has been pancytopenic chronically, previous hematology evaluation was obtained remotely, with a bone marrow biopsy.  Patient has multiple chronic problems and follows up mainly at [place]. Extensive chart review of medical charts in [facility] through care everywhere was performed by me. #Rheumatology problem: Systemic lupus was diagnosed in 2006, patient was started on Imuran in 2007 and discontinued in 2008 due to leukopenia.  He was also on Plaquenil which was discontinued 2007 and discontinued due to cost issue To establish care with [facility] currently on prednisone  Patient also had chronic hepatitis C history, status post Ribavirin interferon alfa treatment.  Hepatitis viral load undetectable last tested in January 2019. #Chronic bilateral lower extremity swelling, no aggravating or alleviating factors. Weight loss, chronic problem for patient.  Reports appetite is continued to have weight loss  #Last colonoscopy in January 2019 which showed diverticulosis and colon polyps. #Easy bruising: Chronic, waxing and waning.  Patient showed me an area of severe bruising after a minor trauma of his left elbow. Denies hematochezia, hematuria, # hematemesis, epistaxis, black tarry stool.    # # S/p IV venofer weekly x 3. Complained about lower extremity swelling after each Venofer dose.  # B12 deficiency, s/p Weekly B12 1040mg x 3 doses.  # Dr.Kern[facility]le. plaquenil stopped and patient is low dose  prednisone 11mdaily.  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  # ED visit on 01/05/2019 due to shortness of breath.  patient was provided nebulizer treatment.  With symptom improved.  No hypoxia.  Patient was given prescription of steroids and released home.  Patient was referred to pulmonology.  INTemelecs a [age].o. male who has above history reviewed by me today presents for follow-up of MDS. Per patient's request, I called patient's wife and per her on speaker so she can hear anti-conversation and also participated in discussion. Patient continues to have fatigue.  Shortness of breath, chronic at baseline.  Exacerbated with exertion. Patient takes prednisone 10 mg daily as.  Valium 5 mg every 12 hours as needed. Patient was seen by Dr. AlLanney Ginsn 02/09/2019 for emphysema. Reports easy bruising.  No acute bleeding events.     Review of Systems  Constitutional: Positive for fatigue. Negative for appetite change, chills, fever and unexpected weight change.  HENT:   Negative for hearing loss and voice change.   Eyes: Negative for eye problems and icterus.  Respiratory: Positive for shortness of breath. Negative for chest tightness and cough.   Cardiovascular: Negative for chest pain and leg swelling.  Gastrointestinal: Negative for abdominal distention and abdominal pain.  Endocrine: Negative for hot flashes.  Genitourinary: Negative for difficulty urinating, dysuria and frequency.   Musculoskeletal: Negative for arthralgias.  Skin: Negative for itching and rash.  Neurological: Negative for light-headedness and numbness.  Hematological: Negative for adenopathy. Bruises/bleeds easily.  Psychiatric/Behavioral: Negative for confusion.     MEDICAL HISTORY:  Past Medical History:  Diagnosis Date  .  Arthritis    RHEUMATOID  . Dyspnea   . Genital  herpes   . Hepatitis    HEP.C  . Hepatitis C   . Hypercholesteremia   . Hypertension   . Lupus ([place])   . Pancytopenia ([place])   . Sjogren's syndrome ([place])     SURGICAL HISTORY: Past Surgical History:  Procedure Laterality Date  . COLONOSCOPY    . COLONOSCOPY WITH PROPOFOL N/A 07/14/2017   Procedure: COLONOSCOPY WITH PROPOFOL;  Surgeon: Lollie Sails, MD;  Location: [facility] ENDOSCOPY;  Service: Endoscopy;  Laterality: N/A;  . ESOPHAGOGASTRODUODENOSCOPY (EGD) WITH PROPOFOL N/A 03/22/2018   Procedure: ESOPHAGOGASTRODUODENOSCOPY (EGD) WITH PROPOFOL;  Surgeon: Lollie Sails, MD;  Location: [facility] ENDOSCOPY;  Service: Endoscopy;  Laterality: N/A;  . HERNIA REPAIR Left   . INGUINAL HERNIA REPAIR Right 08/04/2017   Procedure: HERNIA REPAIR INGUINAL ADULT;  Surgeon: Leonie Green, MD;  Location: [facility] ORS;  Service: General;  Laterality: Right;  . KNEE ARTHROSCOPY      SOCIAL HISTORY: Social History   Socioeconomic History  . Marital status: Married    Spouse name: Not on file  . Number of children: Not on file  . Years of education: Not on file  . Highest education level: Not on file  Occupational History  . Not on file  Social Needs  . Financial resource strain: Not on file  . Food insecurity    Worry: Patient refused    Inability: Patient refused  . Transportation needs    Medical: Patient refused    Non-medical: Patient refused  Tobacco Use  . Smoking status: Former Smoker    Packs/day: 0.50    Quit date: 03/22/2018    Years since quitting: 1.0  . Smokeless tobacco: Never Used  Substance and Sexual Activity  . Alcohol use: No    Frequency: Never  . 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 Sexual activity: Not on file  Lifestyle  . Physical activity    Days per week: Patient refused    Minutes per session: Patient refused  . Stress: Not on file  Relationships  . Social Herbalist on phone: Not on file    Gets together: Not on  file    Attends religious service: Not on file    Active member of club or organization: Not on file    Attends meetings of clubs or organizations: Not on file    Relationship status: Not on file  . Intimate partner violence    Fear of current or ex partner: Not on file    Emotionally abused: Not on file    Physically abused: Not on file    Forced sexual activity: Not on file  Other Topics Concern  . Not on file  Social History Narrative  . Not on file    FAMILY HISTORY: History reviewed. No pertinent family history.  ALLERGIES:  is allergic to eggs or egg-derived products; penicillins; and shellfish-derived products.  MEDICATIONS:  Current Outpatient Medications  Medication Sig Dispense Refill  . albuterol (VENTOLIN HFA) 108 (90 Base) MCG/ACT inhaler Inhale 2 puffs into the lungs as needed.    Marland Kitchen amLODipine (NORVASC) 2.5 MG tablet Take 2.5 mg by mouth daily.    . Calcium Carbonate-Vitamin D3 600-400 MG-UNIT TABS Take 1 tablet by mouth 1 day or 1 dose.    . diazepam (VALIUM) 5 MG tablet Take 5 mg by mouth 2 (two) times daily  as needed.     . dorzolamide-timolol (COSOPT) 22.3-6.8 MG/ML ophthalmic solution INSTILL 1 DROP INTO BOTH EYES TWICE A DAY  6  . ferrous sulfate 325 (65 FE) MG EC tablet Take 1 tablet (325 mg total) by mouth 2 (two) times daily. 60 tablet 5  . finasteride (PROSCAR) 5 MG tablet Take 1 tablet (5 mg total) by mouth daily. 90 tablet 3  . ipratropium-albuterol (DUONEB) 0.5-2.5 (3) MG/3ML SOLN Take 3 mLs by nebulization 4 (four) times daily as needed.    . latanoprost (XALATAN) 0.005 % ophthalmic solution Place 1 drop into both eyes at bedtime.  6  . lisinopril (PRINIVIL,ZESTRIL) 20 MG tablet Take 20 mg by mouth daily.    . predniSONE (DELTASONE) 10 MG tablet Take 10 mg by mouth daily with breakfast.    . vitamin B-12 (CYANOCOBALAMIN) 1000 MCG tablet Take 1 tablet (1,000 mcg total) by mouth daily. 90 tablet 3  . sildenafil (VIAGRA) 100 MG tablet Take 100 mg by mouth  daily as needed for erectile dysfunction.     No current facility-administered medications for this visit.      PHYSICAL EXAMINATION: ECOG PERFORMANCE STATUS: 1 - Symptomatic but completely ambulatory Vitals:   04/13/19 1025  BP: (!) 158/91  Pulse: 73  Resp: 16  Temp: 97.7 F (36.5 C)   Filed Weights   04/13/19 1025  Weight: 127 lb 3.2 oz (57.7 kg)    Physical Exam Constitutional:      General: He is not in acute distress.    Appearance: He is ill-appearing.     Comments: Thin, walk in   HENT:     Head: Normocephalic and atraumatic.  Eyes:     General: No scleral icterus.    Conjunctiva/sclera: Conjunctivae normal.     Pupils: Pupils are equal, round, and reactive to light.  Neck:     Musculoskeletal: Normal range of motion and neck supple.  Cardiovascular:     Rate and Rhythm: Normal rate and regular rhythm.     Heart sounds: Normal heart sounds.  Pulmonary:     Effort: Pulmonary effort is normal. No respiratory distress.     Breath sounds: Normal breath sounds. No wheezing or rales.  Chest:     Chest wall: No tenderness.  Abdominal:     General: Bowel sounds are normal. There is no distension.     Palpations: Abdomen is soft. There is no mass.     Tenderness: There is no abdominal tenderness.  Musculoskeletal: Normal range of motion.        General: No deformity.  Lymphadenopathy:     Cervical: No cervical adenopathy.  Skin:    General: Skin is warm and dry.     Findings: No erythema or rash.     Comments: Multiple area of bruising/echymosis of bilateral upper extremity.  Neurological:     Mental Status: He is alert and oriented to person, place, and time.     Cranial Nerves: No cranial nerve deficit.     Coordination: Coordination normal.  Psychiatric:        Behavior: Behavior normal.        Thought Content: Thought content normal.     LABORATORY DATA:  I have reviewed the data as listed Lab Results  Component Value Date   WBC 7.4 04/13/2019    HGB 12.2 (L) 04/13/2019   HCT 40.5 04/13/2019   MCV 77.1 (L) 04/13/2019   PLT 53 (L) 04/13/2019   Recent Labs  01/05/19 1441 01/12/19 1250 04/13/19 0954  NA 139 138 137  K 4.1 4.1 3.7  CL 104 103 103  CO2 26 25 25   GLUCOSE 111* 197* 110*  BUN 20 23 27*  CREATININE 0.92 0.76 0.82  CALCIUM 8.6* 8.6* 9.4  GFRNONAA >60 >60 >60  GFRAA >60 >60 >60  PROT 7.7 7.7 8.4*  ALBUMIN 4.1 4.2 4.9  AST 16 19 19   ALT 9 10 11   ALKPHOS 65 66 56  BILITOT 1.2 1.0 1.6*   Iron/TIBC/Ferritin/ %Sat    Component Value Date/Time   IRON 103 04/13/2019 0954   TIBC 330 04/13/2019 0954   FERRITIN 67 04/13/2019 0954   IRONPCTSAT 31 04/13/2019 0954   RADIOGRAPHIC STUDIES: I have personally reviewed the radiological images as listed and agreed with the findings in the report. 09/05/2016 2D echoL LVEF 55% 6/20/2017retroperitoneal [place]:  renal cysts, enlarged prostate.  07/29/2017 US abdomen: gallstone without acute cholecystitis, normal liver, spleen, pancrease. Renal cysts.  01/29/2018 CT chest abdomen pelvis.  1. No findings to explain the patient's symptoms. 2. Aortic atherosclerosis (ICD10-170.0). Coronary artery Calcification. 3.  Emphysema (ICD10-J43.9). 4. Cholelithiasis. 5. Probable simple or mildly complex renal cysts, difficult to definitively characterize without precontrast imaging. Lesions are previously evaluated by ultrasound on 07/29/2017.  Fibroscan demonstrated f2/f3 fibrosis  #01/05/2019 CT angiogram chest PE protocol No evidence of acute pulmonary embolism.  Chronic lung disease with emphysema.  New complex fluid within a pre-existing 21 mm right lower lobe lung cyst.  Probably probably inflammatory rather than acute pulmonary abscess.  Chronic T4 compression fracture.  No pleural effusion  ASSESSMENT & PLAN:  1. 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 Iron deficiency anemia, unspecified iron deficiency anemia type   3. Thrombocytopenia ([place])   4. Monoclonal B-cell lymphocytosis    5. Pulmonary emphysema, unspecified emphysema type ([place])   6. Lupus ([place])    # MDS unclassifiable.due to MDS defining cytogenetics abnormality with +8, t(3:3)(q21;q26)  IPSS score 1, INT-1 IPSS R score 3.5, INT Labs are reviewed and discussed with patient.  Also discussed with wife over the phone. Thrombocytopenia, platelet count has worsened, decreased to 53,000. IPF is 1.5, likely due to underproduction.   Discussed with patient that decrease of his platelet counts can be secondary to MDS progression, or other etiologies. Vitamin B12 today showed level of 1076. We again discussed about disease course of MDS with risk of progression to AML, progressive cytopenia etc. The only cure is bone marrow transplant and the patient is likely not a good candidate due to multiple medical problems. Patient is not interested in bone marrow transplant.  I discussed about repeat bone marrow biopsy and he is not interested at this point.  We discussed that if his platelet counts continue to decrease, I recommend starting palliative chemotherapy with hypo-methylating agents.  #Iron deficiency anemia, previously received IV Venofer.  Currently he takes oral iron supplementation. Labs are reviewed.  Ferritin 67, iron saturation 31, hemoglobin 12.2, slightly improved compared to last visit. Recommend patient to continue take oral iron supplementation ferrous sulfate 325 mg daily.  #Monoclonal lymphocytosis, stable.  Normal WBC count, lymphocytopenia chronic, continue to monitor. #Vitamin B12 deficiency, level has improved. #Emphysema, continue to follow-up with pulmonology. #Lupus, positive CCP test.  On prednisone 10 mg daily.  Continue follow-up with rheumatology. We spent sufficient time to discuss many aspect of care, questions were answered to patient's satisfaction.  Orders Placed This Encounter  Procedures  . Immature Platelet Fraction    Standing  Status:   Future    Number of Occurrences:   1     Standing Expiration Date:   04/12/2020  . Technologist smear review    Standing Status:   Future    Number of Occurrences:   1    Standing Expiration Date:   04/12/2020  . CBC with Differential/Platelet    Standing Status:   Future    Standing Expiration Date:   04/12/2020  . Comprehensive metabolic panel    Standing Status:   Future    Standing Expiration Date:   04/12/2020  . Technologist smear review    Standing Status:   Future    Standing Expiration Date:   04/12/2020   Follow up in 4 weeks with repeat blood work  Earlie Server, MD, PhD

## 2019-04-13 NOTE — Progress Notes (Signed)
Mr.De [facility],  Your iron tests show improved iron store. You can take iron tablet once daily from now on.  Vitamin b12 level is sufficient.  Thank you  Dr.Rondy Krupinski

## 2019-04-14 ENCOUNTER — Telehealth: Payer: Self-pay

## 2019-04-14 ENCOUNTER — Telehealth: Payer: Self-pay | Admitting: *Deleted

## 2019-04-14 NOTE — Telephone Encounter (Signed)
Patient's wife informed of results.  She would like to know if he should continue the vitamin B12 supplement.

## 2019-04-14 NOTE — Telephone Encounter (Signed)
Informed.

## 2019-04-14 NOTE — Telephone Encounter (Signed)
I sent him mychart message about his pending results yesterday. Blood work results are good except that his platelet counts are lower, follow up as planned. Wife was called yesterday during the encounter. Thank y ou

## 2019-04-14 NOTE — Telephone Encounter (Signed)
**Note Juan-Identified via Obfuscation** -----   Message from Earlie Server, MD sent at 04/13/2019  8:20 PM EDT ----- Mr.Juan Nguyen,  Your iron tests show improved iron store. You can take iron tablet once daily from now on.  Vitamin b12 level is sufficient.  Thank you  Dr.Yu

## 2019-04-14 NOTE — Telephone Encounter (Signed)
Yes he should.

## 2019-04-14 NOTE — Telephone Encounter (Signed)
Patient called and states he cannot remember what was said about his test results and would like a return call [phone]

## 2019-05-11 ENCOUNTER — Inpatient Hospital Stay: Payer: Medicare HMO | Attending: Oncology

## 2019-05-11 ENCOUNTER — Encounter: Payer: Self-pay | Admitting: Oncology

## 2019-05-11 ENCOUNTER — Other Ambulatory Visit: Payer: Self-pay

## 2019-05-11 ENCOUNTER — Inpatient Hospital Stay (HOSPITAL_BASED_OUTPATIENT_CLINIC_OR_DEPARTMENT_OTHER): Payer: Medicare HMO | Admitting: Oncology

## 2019-05-11 VITALS — BP 133/84 | HR 80 | Temp 97.9°F | Resp 18 | Wt 133.2 lb

## 2019-05-11 DIAGNOSIS — E7800 Pure hypercholesterolemia, unspecified: Secondary | ICD-10-CM | POA: Diagnosis not present

## 2019-05-11 DIAGNOSIS — M329 Systemic lupus erythematosus, unspecified: Secondary | ICD-10-CM | POA: Insufficient documentation

## 2019-05-11 DIAGNOSIS — D696 Thrombocytopenia, unspecified: Secondary | ICD-10-CM | POA: Insufficient documentation

## 2019-05-11 DIAGNOSIS — D72820 Lymphocytosis (symptomatic): Secondary | ICD-10-CM

## 2019-05-11 DIAGNOSIS — E538 Deficiency of other specified B group vitamins: Secondary | ICD-10-CM | POA: Insufficient documentation

## 2019-05-11 DIAGNOSIS — M1990 Unspecified osteoarthritis, unspecified site: Secondary | ICD-10-CM | POA: Diagnosis not present

## 2019-05-11 DIAGNOSIS — Z79899 Other long term (current) drug therapy: Secondary | ICD-10-CM | POA: Insufficient documentation

## 2019-05-11 DIAGNOSIS — Z7189 Other specified counseling: Secondary | ICD-10-CM

## 2019-05-11 DIAGNOSIS — I10 Essential (primary) hypertension: Secondary | ICD-10-CM | POA: Insufficient documentation

## 2019-05-11 DIAGNOSIS — D469 Myelodysplastic syndrome, unspecified: Secondary | ICD-10-CM | POA: Insufficient documentation

## 2019-05-11 LAB — CBC WITH DIFFERENTIAL/PLATELET
Abs Immature Granulocytes: 0.1 10*3/uL — ABNORMAL HIGH (ref 0.00–0.07)
Band Neutrophils: 4 %
Basophils Absolute: 0.1 10*3/uL (ref 0.0–0.1)
Basophils Relative: 1 %
Eosinophils Absolute: 0 10*3/uL (ref 0.0–0.5)
Eosinophils Relative: 0 %
HCT: 41.7 % (ref 39.0–52.0)
Hemoglobin: 12.6 g/dL — ABNORMAL LOW (ref 13.0–17.0)
Lymphocytes Relative: 16 %
Lymphs Abs: 0.8 10*3/uL (ref 0.7–4.0)
MCH: 22.9 pg — ABNORMAL LOW (ref 26.0–34.0)
MCHC: 30.2 g/dL (ref 30.0–36.0)
MCV: 75.8 fL — ABNORMAL LOW (ref 80.0–100.0)
Metamyelocytes Relative: 1 %
Monocytes Absolute: 0.3 10*3/uL (ref 0.1–1.0)
Monocytes Relative: 6 %
Myelocytes: 1 %
Neutro Abs: 3.9 10*3/uL (ref 1.7–7.7)
Neutrophils Relative %: 71 %
Platelets: 67 10*3/uL — ABNORMAL LOW (ref 150–400)
RBC: 5.5 MIL/uL (ref 4.22–5.81)
RDW: 16.7 % — ABNORMAL HIGH (ref 11.5–15.5)
Smear Review: DECREASED
WBC: 5.2 10*3/uL (ref 4.0–10.5)
nRBC: 0 % (ref 0.0–0.2)

## 2019-05-11 LAB — COMPREHENSIVE METABOLIC PANEL
ALT: 11 U/L (ref 0–44)
AST: 19 U/L (ref 15–41)
Albumin: 4.8 g/dL (ref 3.5–5.0)
Alkaline Phosphatase: 50 U/L (ref 38–126)
Anion gap: 7 (ref 5–15)
BUN: 25 mg/dL — ABNORMAL HIGH (ref 8–23)
CO2: 27 mmol/L (ref 22–32)
Calcium: 9 mg/dL (ref 8.9–10.3)
Chloride: 104 mmol/L (ref 98–111)
Creatinine, Ser: 0.86 mg/dL (ref 0.61–1.24)
GFR calc Af Amer: >60 mL/min (ref >60)
GFR calc non Af Amer: >60 mL/min (ref >60)
Glucose, Bld: 103 mg/dL — ABNORMAL HIGH (ref 70–99)
Potassium: 4.5 mmol/L (ref 3.5–5.1)
Sodium: 138 mmol/L (ref 135–145)
Total Bilirubin: 1.4 mg/dL — ABNORMAL HIGH (ref 0.3–1.2)
Total Protein: 8 g/dL (ref 6.5–8.1)

## 2019-05-11 LAB — TECHNOLOGIST SMEAR REVIEW

## 2019-05-11 NOTE — Progress Notes (Signed)
Hematology/Oncology follow up Note [facility] [phone] [phone]   Patient Care Team: Kirk Ruths, MD as PCP - General (Internal Medicine)  REFERRING PROVIDER: Waylan Rocher REASON FOR VISIT Follow up for mangement of MDS, iron deficiency anemia and vitamin B12 deficiency  HISTORY OF PRESENTING ILLNESS:  Juan Nguyen is a  73 y.o.  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 Patient has been pancytopenic chronically, previous hematology evaluation was obtained remotely, with a bone marrow biopsy.  Patient has multiple chronic problems and follows up mainly at [place]. Extensive chart review of medical charts in [facility] through care everywhere was performed by me. #Rheumatology problem: Systemic lupus was diagnosed in 2006, patient was started on Imuran in 2007 and discontinued in 2008 due to leukopenia.  He was also on Plaquenil which was discontinued 2007 and discontinued due to cost issue [facility] currently on prednisone  Patient also had chronic hepatitis C history, status post Ribavirin interferon alfa treatment.  Hepatitis viral load undetectable last tested in January 2019. #Chronic bilateral lower extremity swelling, no aggravating or alleviating factors. Weight loss, chronic problem for patient.  Reports appetite is continued to have weight loss  #Last colonoscopy in January 2019 which showed diverticulosis and colon polyps. #Easy bruising: Chronic, waxing and waning.  Patient showed me an area of severe bruising after a minor trauma of his left elbow. Denies hematochezia, hematuria, # hematemesis, epistaxis, black tarry stool.    # # S/p IV venofer weekly x 3. Complained about lower extremity swelling after each Venofer dose.  # B12 deficiency, s/p Weekly B12 104mg x 3 doses.  # Dr.[facility]le. plaquenil stopped and patient is low dose  prednisone 165mdaily.  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  # ED visit on 01/05/2019 due to shortness of breath.  patient was provided nebulizer treatment.  With symptom improved.  No hypoxia.  Patient was given prescription of steroids and released home.  Patient was referred to pulmonology.  INGillettes a [age].o. male who has above history reviewed by me today presents for follow-up of MDS.  Reports doing well. No new complaints.  Patient is alone. Continue steroid prednisone 10 mg daily for lupus. Continue to have easy bruising all over his body.  Denies any acute bleeding events. No new complaints today.  Chronic fatigue at baseline Denies fever, chills, nausea, vomiting, diarrhea, chest pain,abdominal pain, urinary symptoms,    Review of Systems  Constitutional: Positive for fatigue. Negative for appetite change, chills, fever and unexpected weight change.  HENT:   Negative for hearing loss and voice change.   Eyes: Negative for eye problems and icterus.  Respiratory: Positive for shortness of breath. Negative for chest tightness and cough.   Cardiovascular: Negative for chest pain and leg swelling.  Gastrointestinal: Negative for abdominal distention and abdominal pain.  Endocrine: Negative for hot flashes.  Genitourinary: Negative for difficulty urinating, dysuria and frequency.   Musculoskeletal: Negative for arthralgias.  Skin: Negative for itching and rash.  Neurological: Negative for light-headedness and numbness.  Hematological: Negative for adenopathy. Bruises/bleeds easily.  Psychiatric/Behavioral: Negative for confusion.     MEDICAL HISTORY:  Past Medical History:  Diagnosis Date  . Arthritis    RHEUMATOID  . Dyspnea   . Genital herpes   . Hepatitis  HEP.C  . Hepatitis C   . Hypercholesteremia   . Hypertension   . Lupus  ([place])   . Pancytopenia ([place])   . Sjogren's syndrome ([place])     SURGICAL HISTORY: Past Surgical History:  Procedure Laterality Date  . COLONOSCOPY    . COLONOSCOPY WITH PROPOFOL N/A 07/14/2017   Procedure: COLONOSCOPY WITH PROPOFOL;  Surgeon: Lollie Sails, MD;  Location: [facility] ENDOSCOPY;  Service: Endoscopy;  Laterality: N/A;  . ESOPHAGOGASTRODUODENOSCOPY (EGD) WITH PROPOFOL N/A 03/22/2018   Procedure: ESOPHAGOGASTRODUODENOSCOPY (EGD) WITH PROPOFOL;  Surgeon: Lollie Sails, MD;  Location: [facility] ENDOSCOPY;  Service: Endoscopy;  Laterality: N/A;  . HERNIA REPAIR Left   . INGUINAL HERNIA REPAIR Right 08/04/2017   Procedure: HERNIA REPAIR INGUINAL ADULT;  Surgeon: Leonie Green, MD;  Location: [facility] ORS;  Service: General;  Laterality: Right;  . KNEE ARTHROSCOPY      SOCIAL HISTORY: Social History   Socioeconomic History  . Marital status: Married    Spouse name: Not on file  . Number of children: Not on file  . Years of education: Not on file  . Highest education level: Not on file  Occupational History  . Not on file  Social Needs  . Financial resource strain: Not on file  . Food insecurity    Worry: Patient refused    Inability: Patient refused  . Transportation needs    Medical: Patient refused    Non-medical: Patient refused  Tobacco Use  . Smoking status: Former Smoker    Packs/day: 0.50    Quit date: 03/22/2018    Years since quitting: 1.1  . Smokeless tobacco: Never Used  Substance and Sexual Activity  . Alcohol use: No    Frequency: Never  . 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 Sexual activity: Not on file  Lifestyle  . Physical activity    Days per week: Patient refused    Minutes per session: Patient refused  . Stress: Not on file  Relationships  . Social Herbalist on phone: Not on file    Gets together: Not on file    Attends religious service: Not on file    Active member of club or organization: Not on file     Attends meetings of clubs or organizations: Not on file    Relationship status: Not on file  . Intimate partner violence    Fear of current or ex partner: Not on file    Emotionally abused: Not on file    Physically abused: Not on file    Forced sexual activity: Not on file  Other Topics Concern  . Not on file  Social History Narrative  . Not on file    FAMILY HISTORY: No family history on file.  ALLERGIES:  is allergic to eggs or egg-derived products; penicillins; and shellfish-derived products.  MEDICATIONS:  Current Outpatient Medications  Medication Sig Dispense Refill  . albuterol (VENTOLIN HFA) 108 (90 Base) MCG/ACT inhaler Inhale 2 puffs into the lungs as needed.    Marland Kitchen amLODipine (NORVASC) 2.5 MG tablet Take 2.5 mg by mouth daily.    . Calcium Carbonate-Vitamin D3 600-400 MG-UNIT TABS Take 1 tablet by mouth 1 day or 1 dose.    . diazepam (VALIUM) 5 MG tablet Take 5 mg by mouth 2 (two) times daily as needed.     . dorzolamide-timolol (COSOPT) 22.3-6.8 MG/ML ophthalmic solution INSTILL 1 DROP INTO BOTH EYES TWICE A  DAY  6  . ferrous sulfate 325 (65 FE) MG EC tablet Take 1 tablet (325 mg total) by mouth 2 (two) times daily. 60 tablet 5  . finasteride (PROSCAR) 5 MG tablet Take 1 tablet (5 mg total) by mouth daily. 90 tablet 3  . ipratropium-albuterol (DUONEB) 0.5-2.5 (3) MG/3ML SOLN Take 3 mLs by nebulization 4 (four) times daily as needed.    . latanoprost (XALATAN) 0.005 % ophthalmic solution Place 1 drop into both eyes at bedtime.  6  . lisinopril (PRINIVIL,ZESTRIL) 20 MG tablet Take 20 mg by mouth daily.    . predniSONE (DELTASONE) 10 MG tablet Take 10 mg by mouth daily with breakfast.    . sildenafil (VIAGRA) 100 MG tablet Take 100 mg by mouth daily as needed for erectile dysfunction.    . vitamin B-12 (CYANOCOBALAMIN) 1000 MCG tablet Take 1 tablet (1,000 mcg total) by mouth daily. 90 tablet 3   No current facility-administered medications for this visit.       PHYSICAL EXAMINATION: ECOG PERFORMANCE STATUS: 1 - Symptomatic but completely ambulatory Vitals:   05/11/19 1011  BP: 133/84  Pulse: 80  Resp: 18  Temp: 97.9 F (36.6 C)   Filed Weights   05/11/19 1011  Weight: 133 lb 3.2 oz (60.4 kg)    Physical Exam Constitutional:      General: He is not in acute distress.    Appearance: He is ill-appearing.     Comments: Thin, walk in   HENT:     Head: Normocephalic and atraumatic.  Eyes:     General: No scleral icterus.    Conjunctiva/sclera: Conjunctivae normal.     Pupils: Pupils are equal, round, and reactive to light.  Neck:     Musculoskeletal: Normal range of motion and neck supple.  Cardiovascular:     Rate and Rhythm: Normal rate and regular rhythm.     Heart sounds: Normal heart sounds.  Pulmonary:     Effort: Pulmonary effort is normal. No respiratory distress.     Breath sounds: Normal breath sounds. No wheezing or rales.  Chest:     Chest wall: No tenderness.  Abdominal:     General: Bowel sounds are normal. There is no distension.     Palpations: Abdomen is soft. There is no mass.     Tenderness: There is no abdominal tenderness.  Musculoskeletal: Normal range of motion.        General: No deformity.  Lymphadenopathy:     Cervical: No cervical adenopathy.  Skin:    General: Skin is warm and dry.     Findings: No erythema or rash.     Comments: Multiple area of bruising/echymosis of bilateral upper extremity.  Neurological:     Mental Status: He is alert and oriented to person, place, and time.     Cranial Nerves: No cranial nerve deficit.     Coordination: Coordination normal.  Psychiatric:        Behavior: Behavior normal.        Thought Content: Thought content normal.     LABORATORY DATA:  I have reviewed the data as listed Lab Results  Component Value Date   WBC 5.2 05/11/2019   HGB 12.6 (L) 05/11/2019   HCT 41.7 05/11/2019   MCV 75.8 (L) 05/11/2019   PLT 67 (L) 05/11/2019   Recent Labs     01/12/19 1250 04/13/19 0954 05/11/19 0956  NA 138 137 138  K 4.1 3.7 4.5  CL 103 103 104  CO2 25 25 27   GLUCOSE 197* 110* 103*  BUN 23 27* 25*  CREATININE 0.76 0.82 0.86  CALCIUM 8.6* 9.4 9.0  GFRNONAA >60 >60 >60  GFRAA >60 >60 >60  PROT 7.7 8.4* 8.0  ALBUMIN 4.2 4.9 4.8  AST 19 19 19   ALT 10 11 11   ALKPHOS 66 56 50  BILITOT 1.0 1.6* 1.4*   Iron/TIBC/Ferritin/ %Sat    Component Value Date/Time   IRON 103 04/13/2019 0954   TIBC 330 04/13/2019 0954   FERRITIN 67 04/13/2019 0954   IRONPCTSAT 31 04/13/2019 0954   RADIOGRAPHIC STUDIES: I have personally reviewed the radiological images as listed and agreed with the findings in the report. 09/05/2016 2D echoL LVEF 55% 6/20/2017retroperitoneal [place]:  renal cysts, enlarged prostate.  07/29/2017 US abdomen: gallstone without acute cholecystitis, normal liver, spleen, pancrease. Renal cysts.  01/29/2018 CT chest abdomen pelvis.  1. No findings to explain the patient's symptoms. 2. Aortic atherosclerosis (ICD10-170.0). Coronary artery Calcification. 3.  Emphysema (ICD10-J43.9). 4. Cholelithiasis. 5. Probable simple or mildly complex renal cysts, difficult to definitively characterize without precontrast imaging. Lesions are previously evaluated by ultrasound on 07/29/2017.  Fibroscan demonstrated f2/f3 fibrosis  #01/05/2019 CT angiogram chest PE protocol No evidence of acute pulmonary embolism.  Chronic lung disease with emphysema.  New complex fluid within a pre-existing 21 mm right lower lobe lung cyst.  Probably probably inflammatory rather than acute pulmonary abscess.  Chronic T4 compression fracture.  No pleural effusion  ASSESSMENT & PLAN:  1. 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 Thrombocytopenia ([place])   3. Lupus ([place])   4. Goals of care, counseling/discussion    # MDS unclassifiable.due to MDS defining cytogenetics abnormality with +8, t(3:3)(q21;q26)  IPSS score 1, INT-1-median survival in the absence of therapy 3.53  years IPSS R score 3.5, INT-median survival in the absence of therapy 3 years Labs were mostly pending when the patient is in the office.  He does not want to wait for the lab results.  He wants me to call him  after the results are back and discussed with his wife.  \ I called the patient and wife and discussed with him about his blood work results. Thrombocytopenia, platelet counts from 67,000, slightly improved compared to last visit.  If the patient continues to have clinically relevant thrombocytopenia, hypoventilating agent can be considered We again discussed the possibility of starting hypomethylating agent.  Goal of care is palliative.  Discussed with patient.  -I will also send NGS on the bone marrow.  Patient is not enthusiastic about proceeding with chemotherapy at this point.  Ask patient and wife to update me.  For now continue observation.  #Lupus, positive CCP test.  Continue prednisone 10 mg daily.  Follows up with rheumatology. We spent sufficient time to discuss many aspect of care, questions were answered to patient's satisfaction.  Follow up in 8 weeks with repeat blood work  Earlie Server, MD, PhD

## 2019-05-11 NOTE — Progress Notes (Signed)
Patient here for follow up. No concerns voiced.  °

## 2019-05-16 ENCOUNTER — Other Ambulatory Visit
Admission: RE | Admit: 2019-05-16 | Discharge: 2019-05-16 | Disposition: A | Discharge status: Home / Self Care | Payer: Medicare HMO | Source: Ambulatory Visit | Attending: Internal Medicine | Admitting: Internal Medicine

## 2019-05-16 DIAGNOSIS — I10 Essential (primary) hypertension: Secondary | ICD-10-CM | POA: Diagnosis present

## 2019-05-16 DIAGNOSIS — B1920 Unspecified viral hepatitis C without hepatic coma: Secondary | ICD-10-CM | POA: Diagnosis present

## 2019-05-16 DIAGNOSIS — J449 Chronic obstructive pulmonary disease, unspecified: Secondary | ICD-10-CM | POA: Diagnosis present

## 2019-05-16 LAB — COMP PANEL: LEUKEMIA/LYMPHOMA: Immunophenotypic Profile: 1

## 2019-05-16 LAB — AMMONIA: Ammonia: 12 umol/L (ref 9–35)

## 2019-05-31 ENCOUNTER — Telehealth: Payer: Self-pay

## 2019-05-31 NOTE — Telephone Encounter (Signed)
[facility] results state "on hold" on the [place] site (order #[id number]).  [facility] to check the status. MD notified via email:    The RNA extraction failed and they need to know if they are to proceed with DNA only or is there another specimen available for testing.

## 2019-06-13 NOTE — Telephone Encounter (Signed)
Dr. Tasia Catchings, how do you want to proceed with this?

## 2019-06-20 NOTE — Telephone Encounter (Signed)
Dr. Tasia Catchings, [facility] still has testing "on hold".  Do you want to proceed with DNA testing only?

## 2019-06-21 NOTE — Telephone Encounter (Signed)
sure

## 2019-06-22 NOTE — Telephone Encounter (Signed)
[facility] informed to proceed with testing.

## 2019-06-24 NOTE — Telephone Encounter (Signed)
[facility] testing has resulted. Hard copy given to Dr. Tasia Catchings and copy sent to Medical records to scan in chart.

## 2019-06-27 ENCOUNTER — Encounter (HOSPITAL_COMMUNITY): Payer: Self-pay | Admitting: Oncology

## 2019-06-28 ENCOUNTER — Encounter: Payer: Self-pay | Admitting: Oncology

## 2019-07-04 ENCOUNTER — Emergency Department
Admission: EM | Admit: 2019-07-04 | Discharge: 2019-07-04 | Disposition: A | Discharge status: Home / Self Care | Payer: Medicare HMO | Attending: Student | Admitting: Student

## 2019-07-04 ENCOUNTER — Emergency Department: Payer: Medicare HMO

## 2019-07-04 ENCOUNTER — Other Ambulatory Visit: Payer: Self-pay

## 2019-07-04 ENCOUNTER — Encounter: Payer: Self-pay | Admitting: Emergency Medicine

## 2019-07-04 DIAGNOSIS — J449 Chronic obstructive pulmonary disease, unspecified: Secondary | ICD-10-CM | POA: Diagnosis not present

## 2019-07-04 DIAGNOSIS — M069 Rheumatoid arthritis, unspecified: Secondary | ICD-10-CM | POA: Diagnosis not present

## 2019-07-04 DIAGNOSIS — R0789 Other chest pain: Secondary | ICD-10-CM | POA: Diagnosis not present

## 2019-07-04 DIAGNOSIS — Z87891 Personal history of nicotine dependence: Secondary | ICD-10-CM | POA: Insufficient documentation

## 2019-07-04 DIAGNOSIS — Z79899 Other long term (current) drug therapy: Secondary | ICD-10-CM | POA: Diagnosis not present

## 2019-07-04 DIAGNOSIS — F0390 Unspecified dementia without behavioral disturbance: Secondary | ICD-10-CM | POA: Diagnosis not present

## 2019-07-04 DIAGNOSIS — I10 Essential (primary) hypertension: Secondary | ICD-10-CM | POA: Insufficient documentation

## 2019-07-04 LAB — COMPREHENSIVE METABOLIC PANEL
ALT: 12 U/L (ref 0–44)
AST: 21 U/L (ref 15–41)
Albumin: 4.4 g/dL (ref 3.5–5.0)
Alkaline Phosphatase: 44 U/L (ref 38–126)
Anion gap: 11 (ref 5–15)
BUN: 29 mg/dL — ABNORMAL HIGH (ref 8–23)
CO2: 25 mmol/L (ref 22–32)
Calcium: 9.4 mg/dL (ref 8.9–10.3)
Chloride: 102 mmol/L (ref 98–111)
Creatinine, Ser: 0.9 mg/dL (ref 0.61–1.24)
GFR calc Af Amer: >60 mL/min (ref >60)
GFR calc non Af Amer: >60 mL/min (ref >60)
Glucose, Bld: 107 mg/dL — ABNORMAL HIGH (ref 70–99)
Potassium: 4.2 mmol/L (ref 3.5–5.1)
Sodium: 138 mmol/L (ref 135–145)
Total Bilirubin: 1.6 mg/dL — ABNORMAL HIGH (ref 0.3–1.2)
Total Protein: 8.1 g/dL (ref 6.5–8.1)

## 2019-07-04 LAB — CBC
HCT: 39.8 % (ref 39.0–52.0)
Hemoglobin: 12.1 g/dL — ABNORMAL LOW (ref 13.0–17.0)
MCH: 22.4 pg — ABNORMAL LOW (ref 26.0–34.0)
MCHC: 30.4 g/dL (ref 30.0–36.0)
MCV: 73.8 fL — ABNORMAL LOW (ref 80.0–100.0)
Platelets: 127 10*3/uL — ABNORMAL LOW (ref 150–400)
RBC: 5.39 MIL/uL (ref 4.22–5.81)
RDW: 16.6 % — ABNORMAL HIGH (ref 11.5–15.5)
WBC: 7.8 10*3/uL (ref 4.0–10.5)
nRBC: 0.4 % — ABNORMAL HIGH (ref 0.0–0.2)

## 2019-07-04 LAB — URINALYSIS, COMPLETE (UACMP) WITH MICROSCOPIC
Bacteria, UA: NONE SEEN
Bilirubin Urine: NEGATIVE
Glucose, UA: NEGATIVE mg/dL
Ketones, ur: NEGATIVE mg/dL
Leukocytes,Ua: NEGATIVE
Nitrite: NEGATIVE
Protein, ur: NEGATIVE mg/dL
Specific Gravity, Urine: 1.015 (ref 1.005–1.030)
Squamous Epithelial / HPF: NONE SEEN (ref 0–5)
pH: 6 (ref 5.0–8.0)

## 2019-07-04 LAB — TROPONIN I (HIGH SENSITIVITY)
Troponin I (High Sensitivity): 4 ng/L (ref <18)
Troponin I (High Sensitivity): <2 ng/L (ref <18)

## 2019-07-04 MED ORDER — TRAMADOL HCL 50 MG PO TABS: 50.0000 mg | ORAL_TABLET | Freq: Four times a day (QID) | ORAL | 0 refills | Status: AC | PRN
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
## 2019-07-04 NOTE — ED Provider Notes (Signed)
  Physical Exam  BP (!) 164/87 (BP Location: Left Arm)   Pulse 88   Temp 98.5 F (36.9 C) (Oral)   Resp 20   Ht 5' 8"  (1.727 m)   Wt 68 kg   SpO2 94%   BMI 22.81 kg/m   Physical Exam  ED Course/Procedures   Clinical Course as of Jul 03 1741  Mon Jul 04, 2019  1642 Troponin I (High Sensitivity) [JW]    Clinical Course User Index [JW] Lannie Fields, PA-C    Procedures  MDM  Patient was initially seen and evaluated in triage by Laban Emperor, PA-C.  History and mechanism of injury can be reviewed in her note.  Patient's chest x-ray revealed no evidence of rib fractures, sternal fracture or pneumothorax.  Patient was resting comfortably on exam.  He had no reproducible tenderness to palpation along the right chest wall.  Will obtain repeat troponin and if troponin is within reference range, patient will be discharged.   ----------------------------------------- 5:43 PM on 07/04/2019 -----------------------------------------  Repeat troponin was within reference range.  We will send patient home with a short course of tramadol for pain.  Patient reports that she cannot tolerate anti-inflammatories due to history of thrombocytopenia.       Vallarie Mare [place], PA-C 07/04/19 1744    Lilia Pro., MD 07/04/19 [phone]
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
## 2019-07-11 ENCOUNTER — Other Ambulatory Visit: Payer: Self-pay

## 2019-07-11 NOTE — Progress Notes (Signed)
Patient pre screened for office appointment, no questions or concerns today. Patient reminded of upcoming appointment time and date. 

## 2019-07-12 ENCOUNTER — Inpatient Hospital Stay: Payer: Medicare HMO | Admitting: Oncology

## 2019-07-12 ENCOUNTER — Inpatient Hospital Stay: Payer: Medicare HMO

## 2019-07-13 ENCOUNTER — Encounter: Payer: Self-pay | Admitting: Oncology

## 2019-07-18 ENCOUNTER — Other Ambulatory Visit: Payer: Self-pay | Admitting: Urology

## 2019-07-21 ENCOUNTER — Other Ambulatory Visit: Payer: Self-pay

## 2019-07-21 ENCOUNTER — Telehealth (INDEPENDENT_AMBULATORY_CARE_PROVIDER_SITE_OTHER): Payer: Medicare HMO | Admitting: Urology

## 2019-07-21 DIAGNOSIS — R310 Gross hematuria: Secondary | ICD-10-CM | POA: Diagnosis not present

## 2019-07-21 MED ORDER — FINASTERIDE 5 MG PO TABS: 5.0000 mg | ORAL_TABLET | Freq: Every day | ORAL | 3 refills | Status: AC
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
## 2019-07-27 ENCOUNTER — Other Ambulatory Visit: Payer: Self-pay

## 2019-07-27 ENCOUNTER — Inpatient Hospital Stay: Payer: Medicare HMO | Admitting: Oncology

## 2019-07-27 ENCOUNTER — Inpatient Hospital Stay: Payer: Medicare HMO | Attending: Oncology

## 2019-07-27 ENCOUNTER — Inpatient Hospital Stay: Payer: Medicare HMO

## 2019-07-27 DIAGNOSIS — Z79899 Other long term (current) drug therapy: Secondary | ICD-10-CM | POA: Insufficient documentation

## 2019-07-27 DIAGNOSIS — M329 Systemic lupus erythematosus, unspecified: Secondary | ICD-10-CM

## 2019-07-27 DIAGNOSIS — E7800 Pure hypercholesterolemia, unspecified: Secondary | ICD-10-CM | POA: Diagnosis not present

## 2019-07-27 DIAGNOSIS — M1990 Unspecified osteoarthritis, unspecified site: Secondary | ICD-10-CM | POA: Diagnosis not present

## 2019-07-27 DIAGNOSIS — D696 Thrombocytopenia, unspecified: Secondary | ICD-10-CM

## 2019-07-27 DIAGNOSIS — I10 Essential (primary) hypertension: Secondary | ICD-10-CM | POA: Diagnosis not present

## 2019-07-27 DIAGNOSIS — M3500 Sicca syndrome, unspecified: Secondary | ICD-10-CM | POA: Diagnosis not present

## 2019-07-27 DIAGNOSIS — D469 Myelodysplastic syndrome, unspecified: Secondary | ICD-10-CM | POA: Diagnosis present

## 2019-07-27 DIAGNOSIS — Z87891 Personal history of nicotine dependence: Secondary | ICD-10-CM | POA: Diagnosis not present

## 2019-07-27 LAB — COMPREHENSIVE METABOLIC PANEL
ALT: 10 U/L (ref 0–44)
AST: 18 U/L (ref 15–41)
Albumin: 4.5 g/dL (ref 3.5–5.0)
Alkaline Phosphatase: 51 U/L (ref 38–126)
Anion gap: 7 (ref 5–15)
BUN: 29 mg/dL — ABNORMAL HIGH (ref 8–23)
CO2: 27 mmol/L (ref 22–32)
Calcium: 9.3 mg/dL (ref 8.9–10.3)
Chloride: 104 mmol/L (ref 98–111)
Creatinine, Ser: 0.94 mg/dL (ref 0.61–1.24)
GFR calc Af Amer: >60 mL/min (ref >60)
GFR calc non Af Amer: >60 mL/min (ref >60)
Glucose, Bld: 100 mg/dL — ABNORMAL HIGH (ref 70–99)
Potassium: 4.4 mmol/L (ref 3.5–5.1)
Sodium: 138 mmol/L (ref 135–145)
Total Bilirubin: 1.3 mg/dL — ABNORMAL HIGH (ref 0.3–1.2)
Total Protein: 7.5 g/dL (ref 6.5–8.1)

## 2019-07-27 LAB — CBC WITH DIFFERENTIAL/PLATELET
Abs Immature Granulocytes: 0.41 10*3/uL — ABNORMAL HIGH (ref 0.00–0.07)
Basophils Absolute: 0.1 10*3/uL (ref 0.0–0.1)
Basophils Relative: 1 %
Eosinophils Absolute: 0 10*3/uL (ref 0.0–0.5)
Eosinophils Relative: 0 %
HCT: 37.6 % — ABNORMAL LOW (ref 39.0–52.0)
Hemoglobin: 11 g/dL — ABNORMAL LOW (ref 13.0–17.0)
Immature Granulocytes: 5 %
Lymphocytes Relative: 9 %
Lymphs Abs: 0.7 10*3/uL (ref 0.7–4.0)
MCH: 22.6 pg — ABNORMAL LOW (ref 26.0–34.0)
MCHC: 29.3 g/dL — ABNORMAL LOW (ref 30.0–36.0)
MCV: 77.2 fL — ABNORMAL LOW (ref 80.0–100.0)
Monocytes Absolute: 0.3 10*3/uL (ref 0.1–1.0)
Monocytes Relative: 3 %
Neutro Abs: 7.1 10*3/uL (ref 1.7–7.7)
Neutrophils Relative %: 82 %
Platelets: 60 10*3/uL — ABNORMAL LOW (ref 150–400)
RBC: 4.87 MIL/uL (ref 4.22–5.81)
RDW: 18.5 % — ABNORMAL HIGH (ref 11.5–15.5)
WBC: 8.6 10*3/uL (ref 4.0–10.5)
nRBC: 0.2 % (ref 0.0–0.2)

## 2019-07-28 ENCOUNTER — Inpatient Hospital Stay (HOSPITAL_BASED_OUTPATIENT_CLINIC_OR_DEPARTMENT_OTHER): Payer: Medicare HMO | Admitting: Oncology

## 2019-07-28 ENCOUNTER — Encounter: Payer: Self-pay | Admitting: Oncology

## 2019-07-28 ENCOUNTER — Telehealth: Payer: Self-pay

## 2019-07-28 DIAGNOSIS — D469 Myelodysplastic syndrome, unspecified: Secondary | ICD-10-CM

## 2019-07-28 DIAGNOSIS — Z7189 Other specified counseling: Secondary | ICD-10-CM | POA: Diagnosis not present

## 2019-07-28 DIAGNOSIS — D696 Thrombocytopenia, unspecified: Secondary | ICD-10-CM

## 2019-07-28 IMAGING — RF DG SMALL BOWEL
12 series · 12 of 12 positions shown · non-contrast
Comparison: Abdominal CT scan dated January 29, 2018

CLINICAL DATA: Abdominal pain, weakness, and constipation for many
years. No nausea, vomiting, or diarrhea. History of hernia surgery
in July 2017. The patient reports having bowel movements
typically every-other-day.

EXAM:
SMALL BOWEL SERIES
TECHNIQUE: Following ingestion of thin barium, serial small bowel images were
obtained including spot views of the terminal ileum.
FLUOROSCOPY TIME:  Fluoroscopy Time: 0 minutes, 42 seconds
Radiation exposure Index (if provided by the fluoroscopic device):
25.5 mGy
Number of Acquired Spot Images: 3

[Series 1: t abdomen supine · 0.15mm/px · 1 of 1 slices shown]
[im 1/1]
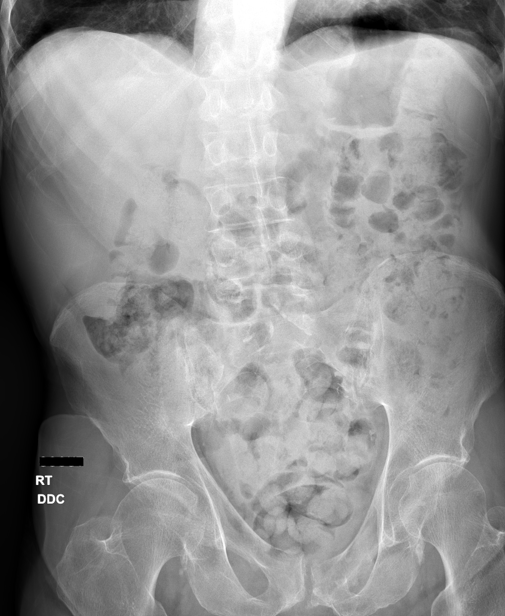

[Series 2: t abdomen barium ap · 0.15mm/px · 1 of 1 slices shown (1 of 7)]
[im 1/1]
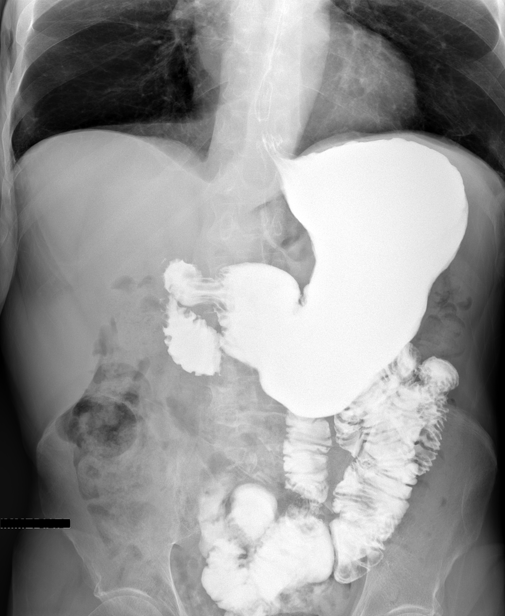

[Series 3: t abdomen barium ap · 0.15mm/px · 1 of 1 slices shown (2 of 7)]
[im 1/1]
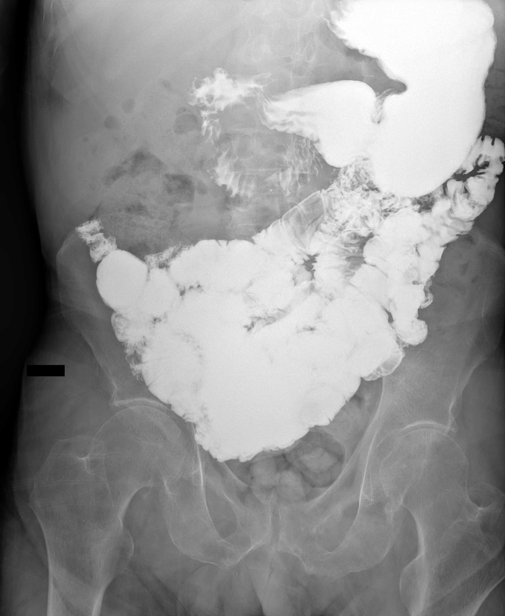

[Series 4: t abdomen barium ap · 0.15mm/px · 1 of 1 slices shown (3 of 7)]
[im 1/1]
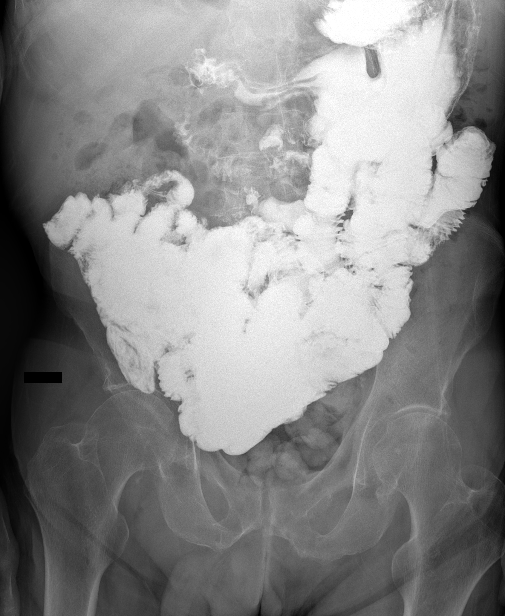

[Series 6: t abdomen barium ap · 0.15mm/px · 1 of 1 slices shown (4 of 7)]
[im 1/1]
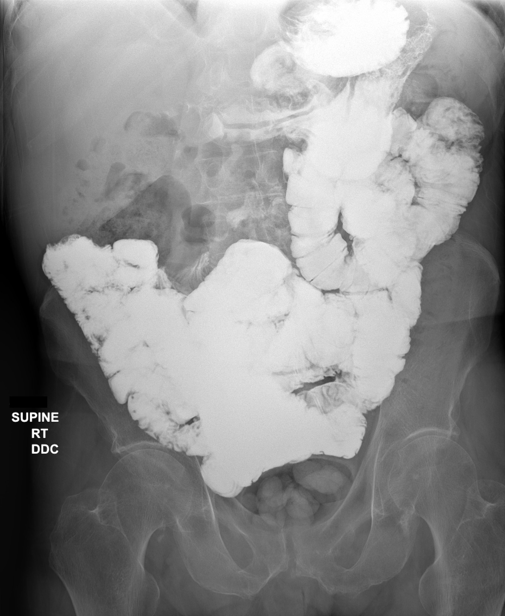

[Series 7: fluoro_barium 2fps_bw · 0.18mm/px · 1 of 1 slices shown (1 of 4)]
[im 1/1]
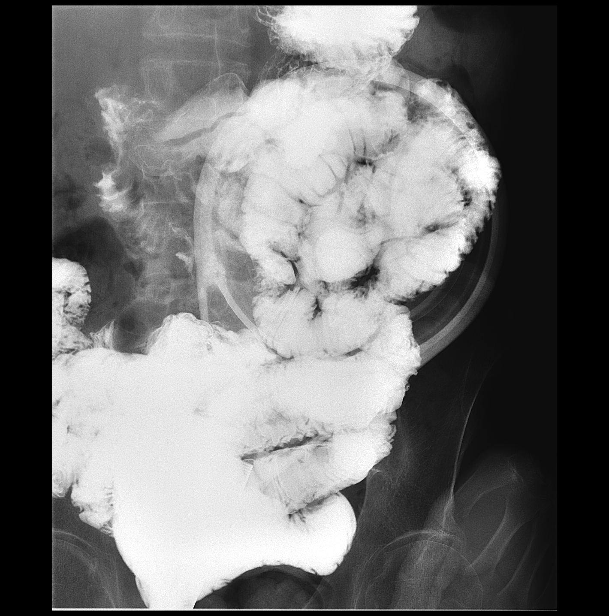

[Series 8: t abdomen barium ap · 0.15mm/px · 1 of 1 slices shown (5 of 7)]
[im 1/1]
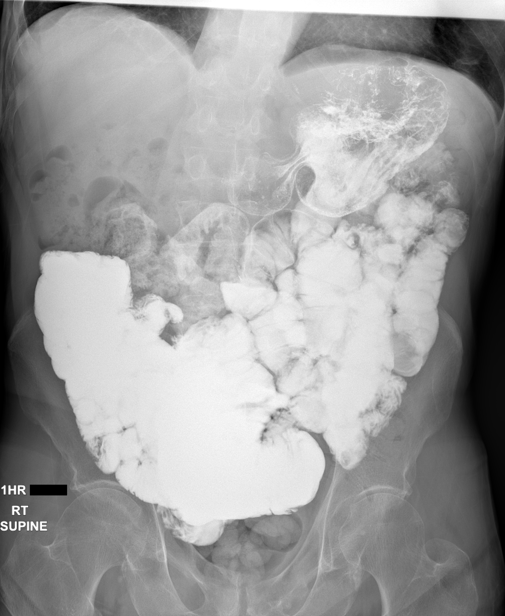

[Series 9: t abdomen barium ap · 0.15mm/px · 1 of 1 slices shown (6 of 7)]
[im 1/1]
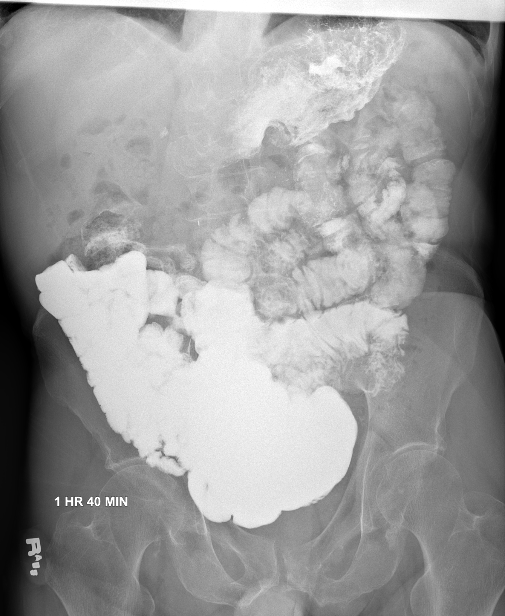

[Series 10: t abdomen barium ap · 0.15mm/px · 1 of 1 slices shown (7 of 7)]
[im 1/1]
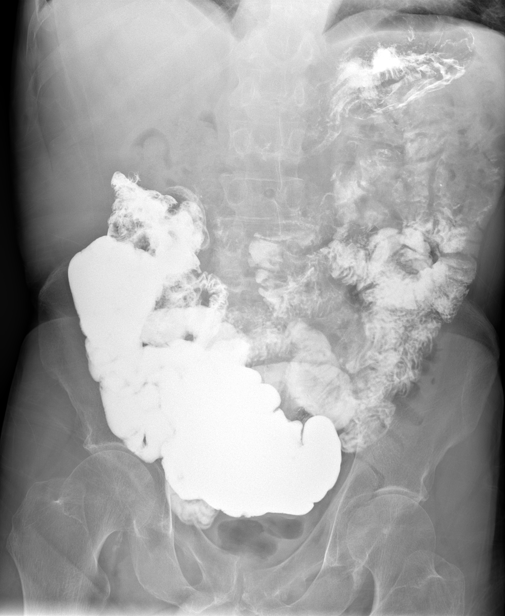

[Series 11: fluoro_barium 2fps_bw · 0.18mm/px · 1 of 1 slices shown (2 of 4)]
[im 1/1]
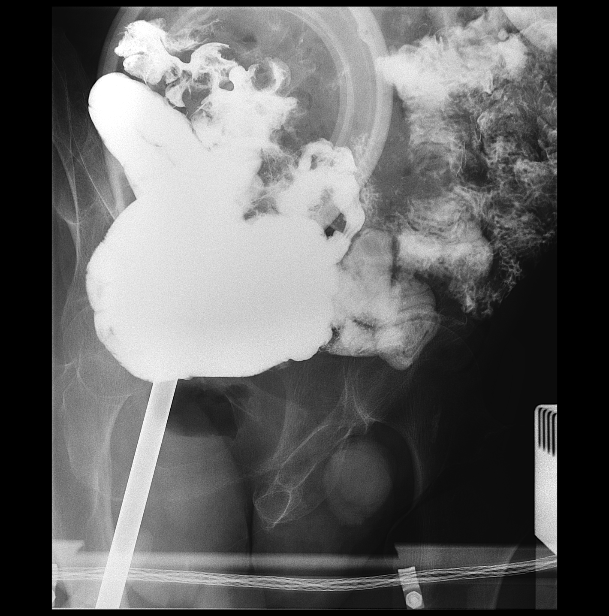

[Series 12: fluoro_barium 2fps_bw · 0.19mm/px · 1 of 1 slices shown (3 of 4)]
[im 1/1]
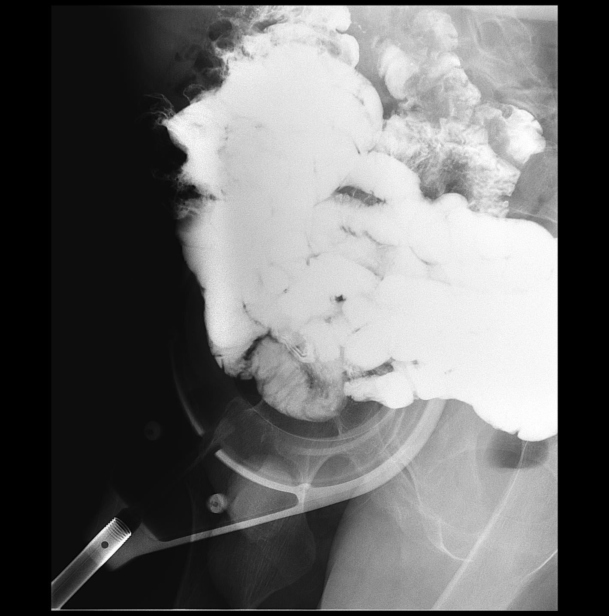

[Series 13: fluoro_barium 2fps_bw · 0.18mm/px · 1 of 1 slices shown (4 of 4)]
[im 1/1]
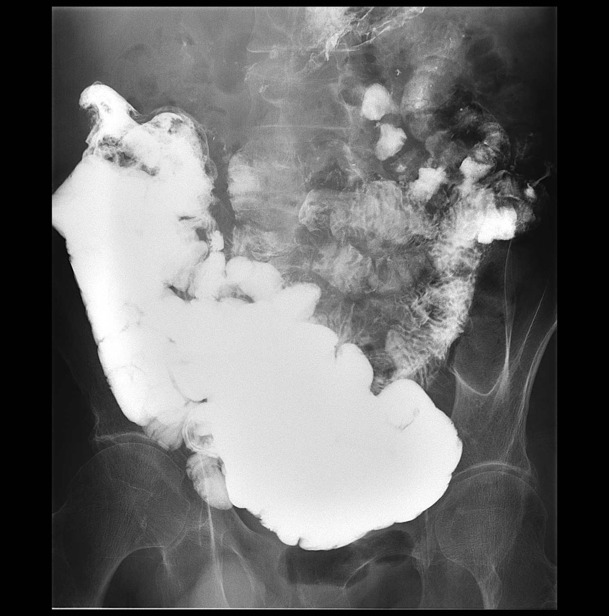

[12 of 12 positions shown; findings below may reference images not displayed]

FINDINGS: The scout radiograph reveals a large amount of stool within the
colon and rectum. The small bowel gas pattern is normal. The patient
ingested the barium without difficulty. Over the course of 2 hours
and 20 minutes the barium passed through the small bowel and reached
the right colon. Numerous loops of ileum were overlapping 1 another
in the right lower abdomen and pelvis and obscured the terminal
ileum. The jejunum demonstrated a normal feathery mucosal pattern.
No abnormal granularity or nodularity was observed. The appearance
of the ileum was normal where visualized. As mentioned above the
terminal ileum could not be demonstrated.
IMPRESSION: Normal appearing small bowel study. Nonvisualization of the terminal
ileum due to overlap of the large number of contrast filled ileal
loops. No tenderness to palpation. No abdominal discomfort reported
during the procedure.

Considerable stool present in the colon on the scout radiograph
consistent with the clinical history of constipation.

## 2019-07-28 NOTE — Telephone Encounter (Signed)
Request for bone marrow biopsy sent to centralized scheduling. Will need to be scheduled with Dr. Tasia Catchings 10 days after biopsy (virtual using wife's phone [phone])
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
## 2019-08-01 NOTE — Telephone Encounter (Signed)
Done...  Pt is schedule for a MyChart Visit on 08/18/19 @ 1015

## 2019-08-01 NOTE — Telephone Encounter (Signed)
Patient scheduled for bone marrow biopsy on 2/1 @ 8:30. Please schedule patient for Mychart visit, 10 days after (wife's cell- [phone]). Let me know once it's scheduled and I will call pt. Thanks.

## 2019-08-01 NOTE — Telephone Encounter (Signed)
Patient's wife, Angelita Ingles, notified of biopsy and MD appt.

## 2019-08-03 NOTE — Progress Notes (Signed)
Patient on schedule for BMB 08/08/2019, called and spoke with wife and patient to be here @ 0730,NPO after MN prior to procedure, and driver for home post procedure. Stated understanding.

## 2019-08-05 ENCOUNTER — Other Ambulatory Visit: Payer: Self-pay | Admitting: Student

## 2019-08-08 ENCOUNTER — Other Ambulatory Visit: Payer: Self-pay

## 2019-08-08 ENCOUNTER — Ambulatory Visit
Admission: RE | Admit: 2019-08-08 | Discharge: 2019-08-08 | Disposition: A | Discharge status: Home / Self Care | Payer: Medicare HMO | Source: Ambulatory Visit | Attending: Oncology | Admitting: Oncology

## 2019-08-08 DIAGNOSIS — R413 Other amnesia: Secondary | ICD-10-CM | POA: Insufficient documentation

## 2019-08-08 DIAGNOSIS — Z87891 Personal history of nicotine dependence: Secondary | ICD-10-CM | POA: Diagnosis not present

## 2019-08-08 DIAGNOSIS — Z7952 Long term (current) use of systemic steroids: Secondary | ICD-10-CM | POA: Insufficient documentation

## 2019-08-08 DIAGNOSIS — D696 Thrombocytopenia, unspecified: Secondary | ICD-10-CM | POA: Insufficient documentation

## 2019-08-08 DIAGNOSIS — Z880 Allergy status to penicillin: Secondary | ICD-10-CM | POA: Insufficient documentation

## 2019-08-08 DIAGNOSIS — D469 Myelodysplastic syndrome, unspecified: Secondary | ICD-10-CM | POA: Diagnosis present

## 2019-08-08 DIAGNOSIS — Z91013 Allergy to seafood: Secondary | ICD-10-CM | POA: Diagnosis not present

## 2019-08-08 DIAGNOSIS — M3500 Sicca syndrome, unspecified: Secondary | ICD-10-CM | POA: Insufficient documentation

## 2019-08-08 DIAGNOSIS — Z91012 Allergy to eggs: Secondary | ICD-10-CM | POA: Diagnosis not present

## 2019-08-08 DIAGNOSIS — I10 Essential (primary) hypertension: Secondary | ICD-10-CM | POA: Insufficient documentation

## 2019-08-08 DIAGNOSIS — Z8619 Personal history of other infectious and parasitic diseases: Secondary | ICD-10-CM | POA: Insufficient documentation

## 2019-08-08 DIAGNOSIS — M069 Rheumatoid arthritis, unspecified: Secondary | ICD-10-CM | POA: Diagnosis not present

## 2019-08-08 DIAGNOSIS — E7800 Pure hypercholesterolemia, unspecified: Secondary | ICD-10-CM | POA: Insufficient documentation

## 2019-08-08 DIAGNOSIS — Z79899 Other long term (current) drug therapy: Secondary | ICD-10-CM | POA: Diagnosis not present

## 2019-08-08 LAB — CBC WITH DIFFERENTIAL/PLATELET
Abs Immature Granulocytes: 0.15 10*3/uL — ABNORMAL HIGH (ref 0.00–0.07)
Basophils Absolute: 0.1 10*3/uL (ref 0.0–0.1)
Basophils Relative: 1 %
Eosinophils Absolute: 0.2 10*3/uL (ref 0.0–0.5)
Eosinophils Relative: 3 %
HCT: 35.1 % — ABNORMAL LOW (ref 39.0–52.0)
Hemoglobin: 10.7 g/dL — ABNORMAL LOW (ref 13.0–17.0)
Immature Granulocytes: 3 %
Lymphocytes Relative: 12 %
Lymphs Abs: 0.7 10*3/uL (ref 0.7–4.0)
MCH: 22.7 pg — ABNORMAL LOW (ref 26.0–34.0)
MCHC: 30.5 g/dL (ref 30.0–36.0)
MCV: 74.4 fL — ABNORMAL LOW (ref 80.0–100.0)
Monocytes Absolute: 0.3 10*3/uL (ref 0.1–1.0)
Monocytes Relative: 6 %
Neutro Abs: 4.5 10*3/uL (ref 1.7–7.7)
Neutrophils Relative %: 75 %
Platelets: 88 10*3/uL — ABNORMAL LOW (ref 150–400)
RBC: 4.72 MIL/uL (ref 4.22–5.81)
RDW: 17 % — ABNORMAL HIGH (ref 11.5–15.5)
WBC: 5.9 10*3/uL (ref 4.0–10.5)
nRBC: 0 % (ref 0.0–0.2)

## 2019-08-08 MED ORDER — MIDAZOLAM HCL 2 MG/2ML IJ SOLN
INTRAMUSCULAR | Status: AC | PRN
  Administered 2019-08-08: 1 mg via INTRAVENOUS

## 2019-08-08 MED ORDER — HEPARIN SOD (PORK) LOCK FLUSH 100 UNIT/ML IV SOLN
INTRAVENOUS | Status: AC
  Filled 2019-08-08: qty 5

## 2019-08-08 MED ORDER — SODIUM CHLORIDE 0.9 % IV SOLN: INTRAVENOUS | Status: DC

## 2019-08-08 MED ORDER — MIDAZOLAM HCL 2 MG/2ML IJ SOLN
INTRAMUSCULAR | Status: AC
  Filled 2019-08-08: qty 2

## 2019-08-08 MED ORDER — FENTANYL CITRATE (PF) 100 MCG/2ML IJ SOLN
INTRAMUSCULAR | Status: AC | PRN
  Administered 2019-08-08: 50 ug via INTRAVENOUS

## 2019-08-08 MED ORDER — FENTANYL CITRATE (PF) 100 MCG/2ML IJ SOLN
INTRAMUSCULAR | Status: AC
  Filled 2019-08-08: qty 2

## 2019-08-08 NOTE — Discharge Instructions (Signed)
Bone Marrow Aspiration and Bone Marrow Biopsy, Adult, Care After This sheet gives you information about how to care for yourself after your procedure. Your health care provider may also give you more specific instructions. If you have problems or questions, contact your health care provider. What can I expect after the procedure? After the procedure, it is common to have:  Mild pain and tenderness.  Swelling.  Bruising. Follow these instructions at home: Puncture site care   Follow instructions from your health care provider about how to take care of the puncture site. Make sure you: ? Wash your hands with soap and water before and after you change your bandage (dressing). If soap and water are not available, use hand sanitizer. ? Change your dressing as told by your health care provider.  Check your puncture site every day for signs of infection. Check for: ? More redness, swelling, or pain. ? Fluid or blood. ? Warmth. ? Pus or a bad smell. Activity  Return to your normal activities as told by your health care provider. Ask your health care provider what activities are safe for you.  Do not lift anything that is heavier than 10 lb (4.5 kg), or the limit that you are told, until your health care provider says that it is safe.  Do not drive for 24 hours if you were given a sedative during your procedure. General instructions   Take over-the-counter and prescription medicines only as told by your health care provider.  Do not take baths, swim, or use a hot tub until your health care provider approves. Ask your health care provider if you may take showers. You may only be allowed to take sponge baths.  If directed, put ice on the affected area. To do this: ? Put ice in a plastic bag. ? Place a towel between your skin and the bag. ? Leave the ice on for 20 minutes, 2-3 times a day.  Keep all follow-up visits as told by your health care provider. This is important. Contact a  health care provider if:  Your pain is not controlled with medicine.  You have a fever.  You have more redness, swelling, or pain around the puncture site.  You have fluid or blood coming from the puncture site.  Your puncture site feels warm to the touch.  You have pus or a bad smell coming from the puncture site. Summary  After the procedure, it is common to have mild pain, tenderness, swelling, and bruising.  Follow instructions from your health care provider about how to take care of the puncture site and what activities are safe for you.  Take over-the-counter and prescription medicines only as told by your health care provider.  Contact a health care provider if you have any signs of infection, such as fluid or blood coming from the puncture site. This information is not intended to replace advice given to you by your health care provider. Make sure you discuss any questions you have with your health care provider. Document Revised: 11/09/2018 Document Reviewed: 11/09/2018 Elsevier Patient Education  2020 [employer]  

## 2019-08-08 NOTE — Procedures (Signed)
Interventional Radiology Procedure:   Indications: Myelodysplastic syndrome  Procedure: CT guided bone marrow biopsy  Findings: 2 aspirates and 1 core from right ilium  Complications: None     EBL: Minimal, less than 10 ml  Plan: Discharge to home in one hour.   Rutger Salton R. Anselm Pancoast, MD  Pager: [phone]

## 2019-08-08 NOTE — H&P (Signed)
Chief Complaint: Thrombocytopenia  Referring Physician(s): Yu,Zhou  Supervising Physician: Markus Daft  Patient Status: [facility] - Out-pt  History of Present Illness: Juan Nguyen is a 74 y.o. male with myelodysplastic syndrome.  He has significant bruising on both arms secondary to thrombocytopenia with platelet count 53,000 back in October.  Most recent platelet count was 60 (07/27/19).  He was seen via tele-visit by Dr. Earlie Server on 07/28/19.  After discussion, he and his wife would like to proceed with treatment with hypomethylating agent.  He is here today for a bone marrow biopsy.  He is NPO. He has some short term memory loss so consent was obtained from his wife.  No nausea/vomiting. No Fever/chills. ROS negative.   Past Medical History:  Diagnosis Date  . Arthritis    RHEUMATOID  . Dyspnea   . Genital herpes   . Hepatitis    HEP.C  . Hepatitis C   . Hypercholesteremia   . Hypertension   . Lupus ([place])   . Pancytopenia ([place])   . Sjogren's syndrome [facility])     Past Surgical History:  Procedure Laterality Date  . COLONOSCOPY    . COLONOSCOPY WITH PROPOFOL N/A 07/14/2017   Procedure: COLONOSCOPY WITH PROPOFOL;  Surgeon: Lollie Sails, MD;  Location: [facility] ENDOSCOPY;  Service: Endoscopy;  Laterality: N/A;  . ESOPHAGOGASTRODUODENOSCOPY (EGD) WITH PROPOFOL N/A 03/22/2018   Procedure: ESOPHAGOGASTRODUODENOSCOPY (EGD) WITH PROPOFOL;  Surgeon: Lollie Sails, MD;  Location: [facility] ENDOSCOPY;  Service: Endoscopy;  Laterality: N/A;  . HERNIA REPAIR Left   . INGUINAL HERNIA REPAIR Right 08/04/2017   Procedure: HERNIA REPAIR INGUINAL ADULT;  Surgeon: Leonie Green, MD;  Location: [facility] ORS;  Service: General;  Laterality: Right;  . KNEE ARTHROSCOPY      Allergies: Eggs or egg-derived products, Penicillins, and Shellfish-derived products  Medications: Prior to Admission medications   Medication Sig Start Date End Date Taking? Authorizing Provider    albuterol (VENTOLIN HFA) 108 (90 Base) MCG/ACT inhaler Inhale 2 puffs into the lungs as needed.   Yes [provider]  amLODipine (NORVASC) 2.5 MG tablet Take 2.5 mg by mouth daily.   Yes [provider]  Calcium Carbonate-Vitamin D3 600-400 MG-UNIT TABS Take 1 tablet by mouth 1 day or 1 dose. 01/20/19  Yes [provider]  diazepam (VALIUM) 5 MG tablet Take 5 mg by mouth 2 (two) times daily as needed.    Yes [provider]  dorzolamide-timolol (COSOPT) 22.3-6.8 MG/ML ophthalmic solution INSTILL 1 DROP INTO BOTH EYES TWICE A DAY 04/01/18  Yes [provider]  ferrous sulfate 325 (65 FE) MG EC tablet Take 1 tablet (325 mg total) by mouth 2 (two) times daily. 07/12/18  Yes Earlie Server, MD  finasteride (PROSCAR) 5 MG tablet Take 1 tablet (5 mg total) by mouth daily. 07/21/19  Yes Billey Co, MD  ipratropium-albuterol (DUONEB) 0.5-2.5 (3) MG/3ML SOLN Take 3 mLs by nebulization 4 (four) times daily as needed. 01/11/19 01/06/20 Yes [provider]  latanoprost (XALATAN) 0.005 % ophthalmic solution Place 1 drop into both eyes at bedtime. 04/01/18  Yes [provider]  lisinopril (PRINIVIL,ZESTRIL) 20 MG tablet Take 20 mg by mouth daily.   Yes [provider]  predniSONE (DELTASONE) 10 MG tablet Take 10 mg by mouth daily with breakfast.   Yes [provider]  sildenafil (VIAGRA) 100 MG tablet Take 100 mg by mouth daily as needed for erectile dysfunction.   Yes [provider]  vitamin B-12 (CYANOCOBALAMIN) 1000 MCG tablet Take 1 tablet (1,000 mcg total) by mouth daily. 07/12/18  Yes Earlie Server, MD     History reviewed. No pertinent family history.  Social History   Socioeconomic History  . Marital status: Married    Spouse name: Not on file  . Number of children: Not on file  . Years of education: Not on file  . Highest education level: Not on file  Occupational History  . Not on file  Tobacco Use  . Smoking status:  Former Smoker    Packs/day: 0.50    Quit date: 03/22/2018    Years since quitting: 1.3  . Smokeless tobacco: Never Used  . Tobacco comment: still smoking 1-2 cigs a day  Substance and Sexual Activity  . Alcohol use: No  . 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 Sexual activity: Not on file  Other Topics Concern  . Not on file  Social History Narrative  . Not on file   Social Determinants of Health   Financial Resource Strain:   . Difficulty of Paying Living Expenses: Not on file  Food Insecurity:   . Worried About Charity fundraiser in the Last Year: Not on file  . Ran Out of Food in the Last Year: Not on file  Transportation Needs:   . Lack of Transportation (Medical): Not on file  . Lack of Transportation (Non-Medical): Not on file  Physical Activity:   . Days of Exercise per Week: Not on file  . Minutes of Exercise per Session: Not on file  Stress:   . Feeling of Stress : Not on file  Social Connections:   . Frequency of Communication with Friends and Family: Not on file  . Frequency of Social Gatherings with Friends and Family: Not on file  . Attends Religious Services: Not on file  . Active Member of Clubs or Organizations: Not on file  . Attends Archivist Meetings: Not on file  . Marital Status: Not on file     Review of Systems: A 12 point ROS discussed and pertinent positives are indicated in the HPI above.  All other systems are negative.  Review of Systems  Vital Signs: BP 130/85   Pulse 87   Temp 98.5 F (36.9 C) (Oral)   Resp 18   Ht _0  (1.727 m)   Wt 68 kg   SpO2 98%   BMI 22.81 kg/m   Physical Exam Vitals reviewed.  Constitutional:      Appearance: Normal appearance.  HENT:     Head: Normocephalic and atraumatic.  Eyes:     Extraocular Movements: Extraocular movements intact.  Cardiovascular:     Rate and Rhythm: Normal rate and regular rhythm.  Pulmonary:     Effort: Pulmonary effort is normal. No  respiratory distress.     Breath sounds: Normal breath sounds.  Abdominal:     General: There is no distension.     Palpations: Abdomen is soft.     Tenderness: There is no abdominal tenderness.  Musculoskeletal:        General: Normal range of motion.     Cervical back: Normal range of motion.     Comments: Significant bruising both arms  Skin:    General: Skin is warm and dry.  Neurological:     General: No focal deficit present.     Mental Status: He is alert. Mental status is at  baseline.     Comments: Some memory loss.   Psychiatric:        Mood and Affect: Mood normal.        Behavior: Behavior normal.     Imaging: No results found.  Labs:  CBC: Recent Labs    04/13/19 0954 05/11/19 0956 07/04/19 1400 07/27/19 1135  WBC 7.4 5.2 7.8 8.6  HGB 12.2* 12.6* 12.1* 11.0*  HCT 40.5 41.7 39.8 37.6*  PLT 53* 67* 127* 60*    COAGS: No results for input(s): INR, APTT in the last 8760 hours.  BMP: Recent Labs    04/13/19 0954 05/11/19 0956 07/04/19 1400 07/27/19 1135  NA 137 138 138 138  K 3.7 4.5 4.2 4.4  CL 103 104 102 104  CO2 _0 GLUCOSE 110* 103* 107* 100*  BUN 27* 25* 29* 29*  CALCIUM 9.4 9.0 9.4 9.3  CREATININE 0.82 0.86 0.90 0.94  GFRNONAA >60 >60 >60 >60  GFRAA >60 >60 >60 >60    LIVER FUNCTION TESTS: Recent Labs    04/13/19 0954 05/11/19 0956 07/04/19 1400 07/27/19 1135  BILITOT 1.6* 1.4* 1.6* 1.3*  AST _1 ALT _2 ALKPHOS 56 50 44 51  PROT 8.4* 8.0 8.1 7.5  ALBUMIN 4.9 4.8 4.4 4.5    TUMOR MARKERS: No results for input(s): AFPTM, CEA, CA199, CHROMGRNA in the last 8760 hours.  Assessment and Plan:  Myelodysplastic syndrome.  Thrombocytopenia.  Will proceed with bone marrow biopsy today by Dr. Anselm Pancoast.  Risks and benefits of bone marrow biopsy was discussed with the patient and/or patient's family including, but not limited to bleeding, infection, damage to adjacent structures or low yield requiring  additional tests.  All of the questions were answered and there is agreement to proceed.  Consent signed and in chart.  Thank you for this interesting consult.  I greatly enjoyed [place] and look forward to participating in their care.  A copy of this report was sent to the requesting provider on this date.  Electronically Signed: Murrell Redden, PA-C   08/08/2019, 8:22 AM      I spent a total of  30 Minutes   in face to face in clinical consultation, greater than 50% of which was counseling/coordinating care for bone marrow biopsy.

## 2019-08-09 ENCOUNTER — Other Ambulatory Visit: Payer: Self-pay

## 2019-08-15 ENCOUNTER — Encounter (HOSPITAL_COMMUNITY): Payer: Self-pay | Admitting: Oncology

## 2019-08-16 LAB — SURGICAL PATHOLOGY

## 2019-08-18 ENCOUNTER — Inpatient Hospital Stay: Payer: Medicare HMO | Attending: Oncology | Admitting: Oncology

## 2019-08-18 ENCOUNTER — Encounter: Payer: Self-pay | Admitting: Oncology

## 2019-08-18 DIAGNOSIS — D469 Myelodysplastic syndrome, unspecified: Secondary | ICD-10-CM | POA: Insufficient documentation

## 2019-08-18 DIAGNOSIS — D509 Iron deficiency anemia, unspecified: Secondary | ICD-10-CM | POA: Diagnosis not present

## 2019-08-18 DIAGNOSIS — D696 Thrombocytopenia, unspecified: Secondary | ICD-10-CM

## 2019-08-18 DIAGNOSIS — M329 Systemic lupus erythematosus, unspecified: Secondary | ICD-10-CM

## 2019-08-18 MED ORDER — FERROUS SULFATE 325 (65 FE) MG PO TBEC: 325.0000 mg | DELAYED_RELEASE_TABLET | Freq: Two times a day (BID) | ORAL | 5 refills | Status: AC

## 2019-08-18 NOTE — Progress Notes (Signed)
Patient contacted for Mychart visit. Per pts wife, Angelita Ingles, pt is very weak, increased confusion and needing more assistance with ADLs.

## 2019-08-18 NOTE — Progress Notes (Signed)
HEMATOLOGY-ONCOLOGY TeleHEALTH VISIT PROGRESS NOTE  I connected with Juan Nguyen on 08/18/19 at 10:15 AM EST by video enabled telemedicine visit and verified that I am speaking with the correct person using two identifiers. I discussed the limitations, risks, security and privacy concerns of performing an evaluation and management service by telemedicine and the availability of in-person appointments. I also discussed with the patient that there may be a patient responsible charge related to this service. The patient expressed understanding and agreed to proceed.   Other persons participating in the visit and their role in the encounter:  Wife who helped patient to set up virtual visit.  She also participated in discussion.  Patient's location: Home  Provider's location: office Chief Complaint: [address] is a 74 y.o. male who has above history reviewed by me today presents for follow up visit for management of MDS Problems and complaints are listed below:  Patient is a poor historian.  Most history was obtained from wife.  Patient had bone marrow biopsy reevaluation of bone marrow  Wife reports patient has intermittent confusion episodes.  Forgetful. Continue to have chronic bruising.  Otherwise no new complaints    Review of Systems  Constitutional: Positive for fatigue. Negative for appetite change, chills, fever and unexpected weight change.  HENT:   Negative for hearing loss and voice change.   Eyes: Negative for eye problems and icterus.  Respiratory: Negative for chest tightness, cough and shortness of breath.   Cardiovascular: Negative for chest pain and leg swelling.  Gastrointestinal: Negative for abdominal distention and abdominal pain.  Endocrine: Negative for hot flashes.  Genitourinary: Negative for difficulty urinating, dysuria and frequency.   Musculoskeletal: Negative for arthralgias.  Skin: Negative for itching and rash.   Neurological: Negative for light-headedness and numbness.  Hematological: Negative for adenopathy. Bruises/bleeds easily.  Psychiatric/Behavioral: Positive for confusion.    Past Medical History:  Diagnosis Date  . Arthritis    RHEUMATOID  . Dyspnea   . Genital herpes   . Hepatitis    HEP.C  . Hepatitis C   . Hypercholesteremia   . Hypertension   . Lupus ([place])   . Pancytopenia ([place])   . Sjogren's syndrome [facility])    Past Surgical History:  Procedure Laterality Date  . COLONOSCOPY    . COLONOSCOPY WITH PROPOFOL N/A 07/14/2017   Procedure: COLONOSCOPY WITH PROPOFOL;  Surgeon: Lollie Sails, MD;  Location: [facility] ENDOSCOPY;  Service: Endoscopy;  Laterality: N/A;  . ESOPHAGOGASTRODUODENOSCOPY (EGD) WITH PROPOFOL N/A 03/22/2018   Procedure: ESOPHAGOGASTRODUODENOSCOPY (EGD) WITH PROPOFOL;  Surgeon: Lollie Sails, MD;  Location: [facility] ENDOSCOPY;  Service: Endoscopy;  Laterality: N/A;  . HERNIA REPAIR Left   . INGUINAL HERNIA REPAIR Right 08/04/2017   Procedure: HERNIA REPAIR INGUINAL ADULT;  Surgeon: Leonie Green, MD;  Location: [facility] ORS;  Service: General;  Laterality: Right;  . KNEE ARTHROSCOPY      History reviewed. No pertinent family history.  Social History   Socioeconomic History  . Marital status: Married    Spouse name: Not on file  . Number of children: Not on file  . Years of education: Not on file  . Highest education level: Not on file  Occupational History  . Not on file  Tobacco Use  . Smoking status: Former Smoker    Packs/day: 0.50    Quit date: 03/22/2018    Years since quitting: 1.4  . Smokeless tobacco: Never Used  . Tobacco  comment: still smoking 1-2 cigs a day  Substance and Sexual Activity  . Alcohol use: No  . 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 Sexual activity: Not on file  Other Topics Concern  . Not on file  Social History Narrative  . Not on file   Social Determinants of Health   Financial Resource  Strain:   . Difficulty of Paying Living Expenses: Not on file  Food Insecurity:   . Worried About Charity fundraiser in the Last Year: Not on file  . Ran Out of Food in the Last Year: Not on file  Transportation Needs:   . Lack of Transportation (Medical): Not on file  . Lack of Transportation (Non-Medical): Not on file  Physical Activity:   . Days of Exercise per Week: Not on file  . Minutes of Exercise per Session: Not on file  Stress:   . Feeling of Stress : Not on file  Social Connections:   . Frequency of Communication with Friends and Family: Not on file  . Frequency of Social Gatherings with Friends and Family: Not on file  . Attends Religious Services: Not on file  . Active Member of Clubs or Organizations: Not on file  . Attends Archivist Meetings: Not on file  . Marital Status: Not on file  Intimate Partner Violence:   . Fear of Current or Ex-Partner: Not on file  . Emotionally Abused: Not on file  . Physically Abused: Not on file  . Sexually Abused: Not on file    Current Outpatient Medications on File Prior to Visit  Medication Sig Dispense Refill  . albuterol (VENTOLIN HFA) 108 (90 Base) MCG/ACT inhaler Inhale 2 puffs into the lungs as needed.    Marland Kitchen amLODipine (NORVASC) 2.5 MG tablet Take 2.5 mg by mouth daily.    . Calcium Carbonate-Vitamin D3 600-400 MG-UNIT TABS Take 1 tablet by mouth 1 day or 1 dose.    . diazepam (VALIUM) 5 MG tablet Take 5 mg by mouth 2 (two) times daily as needed.     . dorzolamide-timolol (COSOPT) 22.3-6.8 MG/ML ophthalmic solution INSTILL 1 DROP INTO BOTH EYES TWICE A DAY  6  . ferrous sulfate 325 (65 FE) MG EC tablet Take 1 tablet (325 mg total) by mouth 2 (two) times daily. 60 tablet 5  . finasteride (PROSCAR) 5 MG tablet Take 1 tablet (5 mg total) by mouth daily. 90 tablet 3  . ipratropium-albuterol (DUONEB) 0.5-2.5 (3) MG/3ML SOLN Take 3 mLs by nebulization 4 (four) times daily as needed.    . latanoprost (XALATAN) 0.005 %  ophthalmic solution Place 1 drop into both eyes at bedtime.  6  . lisinopril (PRINIVIL,ZESTRIL) 20 MG tablet Take 20 mg by mouth daily.    . predniSONE (DELTASONE) 10 MG tablet Take 10 mg by mouth daily with breakfast.    . sildenafil (VIAGRA) 100 MG tablet Take 100 mg by mouth daily as needed for erectile dysfunction.    . vitamin B-12 (CYANOCOBALAMIN) 1000 MCG tablet Take 1 tablet (1,000 mcg total) by mouth daily. 90 tablet 3   No current facility-administered medications on file prior to visit.    Allergies  Allergen Reactions  . Eggs Or Egg-Derived Products Nausea Only  . Penicillins Other (See Comments)    Unknown Has patient had a PCN reaction causing immediate rash, facial/tongue/throat swelling, SOB or lightheadedness with hypotension: Unknown Has patient had a PCN reaction causing severe  rash involving mucus membranes or skin necrosis: Unknown Has patient had a PCN reaction that required hospitalization: Unknown Has patient had a PCN reaction occurring within the last 10 years: Unknown If all of the above answers are "NO", then may proceed with Cephalosporin use.   Marland Kitchen Shellfish-Derived Products Hives       Observations/Objective: Today's Vitals   08/18/19 1008  PainSc: 0-No pain   There is no height or weight on file to calculate BMI.  Physical Exam  Constitutional: No distress.  Psychiatric: Mood normal.    CBC    Component Value Date/Time   WBC 5.9 08/08/2019 0809   RBC 4.72 08/08/2019 0809   HGB 10.7 (L) 08/08/2019 0809   HCT 35.1 (L) 08/08/2019 0809   PLT 88 (L) 08/08/2019 0809   MCV 74.4 (L) 08/08/2019 0809   MCH 22.7 (L) 08/08/2019 0809   MCHC 30.5 08/08/2019 0809   RDW 17.0 (H) 08/08/2019 0809   LYMPHSABS 0.7 08/08/2019 0809   MONOABS 0.3 08/08/2019 0809   EOSABS 0.2 08/08/2019 0809   BASOSABS 0.1 08/08/2019 0809    CMP     Component Value Date/Time   NA 138 07/27/2019 1135   K 4.4 07/27/2019 1135   CL 104 07/27/2019 1135   CO2 27 07/27/2019  1135   GLUCOSE 100 (H) 07/27/2019 1135   BUN 29 (H) 07/27/2019 1135   CREATININE 0.94 07/27/2019 1135   CALCIUM 9.3 07/27/2019 1135   PROT 7.5 07/27/2019 1135   ALBUMIN 4.5 07/27/2019 1135   AST 18 07/27/2019 1135   ALT 10 07/27/2019 1135   ALKPHOS 51 07/27/2019 1135   BILITOT 1.3 (H) 07/27/2019 1135   GFRNONAA >60 07/27/2019 1135   GFRAA >60 07/27/2019 1135     Assessment and Plan: 1. MDS (myelodysplastic syndrome) ([place])   2. Thrombocytopenia ([place])   3. Lupus ([place])   4. Iron deficiency anemia, unspecified iron deficiency anemia type     Labs reviewed and discussed with patient and patient's wife.  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 SRSF2, is associated with a poor prognosis.  STA G2 is associated with a poor prognosis.  08/08/2019 BM biopsy results were discussed  Mildly hypercellular marrow with trilineage hematopoiesis. Similar to previous bone marrow biopsy, there is no significant morphologic evidence of dysplasia.  Blast were not increased.  Cytogenetics were normal.  Discussed with patient that based on the above results, I will hold off starting hypomethylating agents for now. I also discussed patient's case on tumor board.  #Thrombocytopenia, fluctuation of patient's platelet counts. #Lupus, follow-up with rheumatology.  Patient is on low-dose prednisone. #Microcytic anemia, hemoglobin 10.7.  Will check iron panel. Continue oral iron supplementation with ferrous sulfate 325 mg twice daily.  Follow Up Instructions: 3 months   I discussed the assessment and treatment plan with the patient. The patient was provided an opportunity to ask questions and all were answered. The patient agreed with the plan and demonstrated an understanding of  the instructions.  The patient was advised to call back or seek an in-person evaluation if the symptoms worsen or if the condition fails to improve as anticipated.    Earlie Server, MD 08/18/2019 2:51 PM

## 2019-09-06 ENCOUNTER — Inpatient Hospital Stay: Payer: Medicare HMO | Attending: Oncology

## 2019-11-03 ENCOUNTER — Other Ambulatory Visit: Payer: Self-pay | Admitting: Neurology

## 2019-11-03 DIAGNOSIS — F0280 Dementia in other diseases classified elsewhere without behavioral disturbance: Secondary | ICD-10-CM

## 2019-11-03 DIAGNOSIS — G3183 Dementia with Lewy bodies: Secondary | ICD-10-CM

## 2019-11-07 ENCOUNTER — Telehealth: Payer: Self-pay | Admitting: *Deleted

## 2019-11-07 NOTE — Telephone Encounter (Signed)
I am happy to discuss details during his appointment. If she has any specific questions, let me know.

## 2019-11-07 NOTE — Telephone Encounter (Signed)
Wife called to report that patient has been diagnosed with Lewy Body Dementia and that he has an upcoming lab appointment. She is requesting a return call from Dr Tasia Catchings to discuss this [phone]

## 2019-11-11 ENCOUNTER — Encounter: Payer: Self-pay | Admitting: Oncology

## 2019-11-11 ENCOUNTER — Inpatient Hospital Stay: Payer: Medicare HMO | Attending: Oncology

## 2019-11-11 ENCOUNTER — Other Ambulatory Visit: Payer: Self-pay

## 2019-11-11 DIAGNOSIS — D509 Iron deficiency anemia, unspecified: Secondary | ICD-10-CM | POA: Insufficient documentation

## 2019-11-11 DIAGNOSIS — Z79899 Other long term (current) drug therapy: Secondary | ICD-10-CM | POA: Diagnosis not present

## 2019-11-11 DIAGNOSIS — D469 Myelodysplastic syndrome, unspecified: Secondary | ICD-10-CM | POA: Diagnosis not present

## 2019-11-11 DIAGNOSIS — M329 Systemic lupus erythematosus, unspecified: Secondary | ICD-10-CM | POA: Insufficient documentation

## 2019-11-11 DIAGNOSIS — D696 Thrombocytopenia, unspecified: Secondary | ICD-10-CM | POA: Diagnosis not present

## 2019-11-11 DIAGNOSIS — F0281 Dementia in other diseases classified elsewhere with behavioral disturbance: Secondary | ICD-10-CM | POA: Diagnosis not present

## 2019-11-11 DIAGNOSIS — G3183 Dementia with Lewy bodies: Secondary | ICD-10-CM | POA: Insufficient documentation

## 2019-11-11 DIAGNOSIS — F0280 Dementia in other diseases classified elsewhere without behavioral disturbance: Secondary | ICD-10-CM | POA: Insufficient documentation

## 2019-11-11 LAB — CBC WITH DIFFERENTIAL/PLATELET
Abs Immature Granulocytes: 0.14 10*3/uL — ABNORMAL HIGH (ref 0.00–0.07)
Basophils Absolute: 0 10*3/uL (ref 0.0–0.1)
Basophils Relative: 1 %
Eosinophils Absolute: 0.1 10*3/uL (ref 0.0–0.5)
Eosinophils Relative: 4 %
HCT: 37.3 % — ABNORMAL LOW (ref 39.0–52.0)
Hemoglobin: 11.2 g/dL — ABNORMAL LOW (ref 13.0–17.0)
Immature Granulocytes: 4 %
Lymphocytes Relative: 24 %
Lymphs Abs: 0.9 10*3/uL (ref 0.7–4.0)
MCH: 23.6 pg — ABNORMAL LOW (ref 26.0–34.0)
MCHC: 30 g/dL (ref 30.0–36.0)
MCV: 78.5 fL — ABNORMAL LOW (ref 80.0–100.0)
Monocytes Absolute: 0.3 10*3/uL (ref 0.1–1.0)
Monocytes Relative: 6 %
Neutro Abs: 2.4 10*3/uL (ref 1.7–7.7)
Neutrophils Relative %: 61 %
Platelets: 87 10*3/uL — ABNORMAL LOW (ref 150–400)
RBC: 4.75 MIL/uL (ref 4.22–5.81)
RDW: 19.8 % — ABNORMAL HIGH (ref 11.5–15.5)
WBC: 4 10*3/uL (ref 4.0–10.5)
nRBC: 0.5 % — ABNORMAL HIGH (ref 0.0–0.2)

## 2019-11-11 LAB — COMPREHENSIVE METABOLIC PANEL
ALT: 10 U/L (ref 0–44)
AST: 18 U/L (ref 15–41)
Albumin: 4.4 g/dL (ref 3.5–5.0)
Alkaline Phosphatase: 39 U/L (ref 38–126)
Anion gap: 10 (ref 5–15)
BUN: 29 mg/dL — ABNORMAL HIGH (ref 8–23)
CO2: 26 mmol/L (ref 22–32)
Calcium: 8.9 mg/dL (ref 8.9–10.3)
Chloride: 108 mmol/L (ref 98–111)
Creatinine, Ser: 0.86 mg/dL (ref 0.61–1.24)
GFR calc Af Amer: >60 mL/min (ref >60)
GFR calc non Af Amer: >60 mL/min (ref >60)
Glucose, Bld: 95 mg/dL (ref 70–99)
Potassium: 3.9 mmol/L (ref 3.5–5.1)
Sodium: 144 mmol/L (ref 135–145)
Total Bilirubin: 2.2 mg/dL — ABNORMAL HIGH (ref 0.3–1.2)
Total Protein: 7.6 g/dL (ref 6.5–8.1)

## 2019-11-11 LAB — IMMATURE PLATELET FRACTION: Immature Platelet Fraction: 1.5 % (ref 1.2–8.6)

## 2019-11-14 ENCOUNTER — Inpatient Hospital Stay (HOSPITAL_BASED_OUTPATIENT_CLINIC_OR_DEPARTMENT_OTHER): Payer: Medicare HMO | Admitting: Oncology

## 2019-11-14 ENCOUNTER — Encounter: Payer: Self-pay | Admitting: Oncology

## 2019-11-14 DIAGNOSIS — M329 Systemic lupus erythematosus, unspecified: Secondary | ICD-10-CM | POA: Diagnosis not present

## 2019-11-14 DIAGNOSIS — D509 Iron deficiency anemia, unspecified: Secondary | ICD-10-CM

## 2019-11-14 DIAGNOSIS — G3183 Dementia with Lewy bodies: Secondary | ICD-10-CM

## 2019-11-14 DIAGNOSIS — D469 Myelodysplastic syndrome, unspecified: Secondary | ICD-10-CM

## 2019-11-14 DIAGNOSIS — D696 Thrombocytopenia, unspecified: Secondary | ICD-10-CM

## 2019-11-14 DIAGNOSIS — F0281 Dementia in other diseases classified elsewhere with behavioral disturbance: Secondary | ICD-10-CM

## 2019-11-14 DIAGNOSIS — Z7189 Other specified counseling: Secondary | ICD-10-CM

## 2019-11-14 NOTE — Progress Notes (Signed)
Patient verified using two identifiers for virtual visit via telephone today.  His wife would like Dr. Tasia Catchings to know he has been been diagnosed with Lewy Body Dementia.

## 2019-11-14 NOTE — Progress Notes (Signed)
HEMATOLOGY-ONCOLOGY TeleHEALTH VISIT PROGRESS NOTE  I connected with Juan Nguyen on 11/14/19 at  1:45 PM EDT by video enabled telemedicine visit and verified that I am speaking with the correct person using two identifiers. I discussed the limitations, risks, security and privacy concerns of performing an evaluation and management service by telemedicine and the availability of in-person appointments. I also discussed with the patient that there may be a patient responsible charge related to this service. The patient expressed understanding and agreed to proceed.   Other persons participating in the visit and their role in the encounter:  Wife who helped patient to set up virtual visit.  She also participated in discussion.  Patient's location: Home  Provider's location: office Chief Complaint: [place] is a 74 y.o. male who has above history reviewed by me today presents for follow up visit for management of MDS Problems and complaints are listed below:  Patient is a poor historian.  Most history was obtained from wife.  Per wife patient was recently diagnosed of Lowy body dementia.  Patient is undergoing additional neurological work-up, EEG/MRI . Patient has been recommended to start Seroquel at night. Wife reports that patient had breathing problems he has nebulizer set up at home. Denies any acute bleeding events.  Patient reports feeling fatigued not able to do much   Review of Systems  Constitutional: Positive for fatigue. Negative for appetite change, chills, fever and unexpected weight change.  HENT:   Negative for hearing loss and voice change.   Eyes: Negative for eye problems and icterus.  Respiratory: Negative for chest tightness, cough and shortness of breath.   Cardiovascular: Negative for chest pain and leg swelling.  Gastrointestinal: Negative for abdominal distention and abdominal pain.  Endocrine: Negative for hot flashes.   Genitourinary: Negative for difficulty urinating, dysuria and frequency.   Musculoskeletal: Negative for arthralgias.  Skin: Negative for itching and rash.  Neurological: Negative for light-headedness and numbness.  Hematological: Negative for adenopathy. Bruises/bleeds easily.  Psychiatric/Behavioral: Positive for confusion.    Past Medical History:  Diagnosis Date  . Arthritis    RHEUMATOID  . Dyspnea   . Genital herpes   . Hepatitis    HEP.C  . Hepatitis C   . Hypercholesteremia   . Hypertension   . Lewy body dementia ([place])   . Lupus ([place])   . Pancytopenia ([place])   . Sjogren's syndrome [facility])    Past Surgical History:  Procedure Laterality Date  . COLONOSCOPY    . COLONOSCOPY WITH PROPOFOL N/A 07/14/2017   Procedure: COLONOSCOPY WITH PROPOFOL;  Surgeon: Lollie Sails, MD;  Location: [facility] ENDOSCOPY;  Service: Endoscopy;  Laterality: N/A;  . ESOPHAGOGASTRODUODENOSCOPY (EGD) WITH PROPOFOL N/A 03/22/2018   Procedure: ESOPHAGOGASTRODUODENOSCOPY (EGD) WITH PROPOFOL;  Surgeon: Lollie Sails, MD;  Location[facility] ENDOSCOPY;  Service: Endoscopy;  Laterality: N/A;  . HERNIA REPAIR Left   . INGUINAL HERNIA REPAIR Right 08/04/2017   Procedure: HERNIA REPAIR INGUINAL ADULT;  Surgeon: Leonie Green, MD;  Location: [facility] ORS;  Service: General;  Laterality: Right;  . KNEE ARTHROSCOPY      No family history on file.  Social History   Socioeconomic History  . Marital status: Married    Spouse name: Not on file  . Number of children: Not on file  . Years of education: Not on file  . Highest education level: Not on file  Occupational History  . Not on file  Tobacco  Use  . Smoking status: Former Smoker    Packs/day: 0.50    Quit date: 03/22/2018    Years since quitting: 1.6  . Smokeless tobacco: Never Used  . Tobacco comment: still smoking 1-2 cigs a day  Substance and Sexual Activity  . Alcohol use: No  . 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  Sexual activity: Not on file  Other Topics Concern  . Not on file  Social History Narrative  . Not on file   Social Determinants of Health   Financial Resource Strain:   . Difficulty of Paying Living Expenses:   Food Insecurity:   . Worried About Charity fundraiser in the Last Year:   . Arboriculturist in the Last Year:   Transportation Needs:   . Film/video editor (Medical):   Marland Kitchen Lack of Transportation (Non-Medical):   Physical Activity:   . Days of Exercise per Week:   . Minutes of Exercise per Session:   Stress:   . Feeling of Stress :   Social Connections:   . Frequency of Communication with Friends and Family:   . Frequency of Social Gatherings with Friends and Family:   . Attends Religious Services:   . Active Member of Clubs or Organizations:   . Attends Archivist Meetings:   Marland Kitchen Marital Status:   Intimate Partner Violence:   . Fear of Current or Ex-Partner:   . Emotionally Abused:   Marland Kitchen Physically Abused:   . Sexually Abused:     Current Outpatient Medications on File Prior to Visit  Medication Sig Dispense Refill  . albuterol (VENTOLIN HFA) 108 (90 Base) MCG/ACT inhaler Inhale 2 puffs into the lungs as needed.    . Calcium Carbonate-Vitamin D3 600-400 MG-UNIT TABS Take 1 tablet by mouth 1 day or 1 dose.    . diazepam (VALIUM) 5 MG tablet Take 5 mg by mouth 2 (two) times daily as needed.     . dorzolamide-timolol (COSOPT) 22.3-6.8 MG/ML ophthalmic solution INSTILL 1 DROP INTO BOTH EYES TWICE A DAY  6  . ferrous sulfate 325 (65 FE) MG EC tablet Take 1 tablet (325 mg total) by mouth 2 (two) times daily. 60 tablet 5  . finasteride (PROSCAR) 5 MG tablet Take 1 tablet (5 mg total) by mouth daily. 90 tablet 3  . ipratropium-albuterol (DUONEB) 0.5-2.5 (3) MG/3ML SOLN Take 3 mLs by nebulization 4 (four) times daily as needed.    . latanoprost (XALATAN) 0.005 % ophthalmic solution Place 1 drop into both eyes at bedtime.  6  . lisinopril (ZESTRIL) 20 MG tablet  TAKE 1 TABLET BY MOUTH EVERY DAY    . predniSONE (DELTASONE) 10 MG tablet Take 10 mg by mouth daily with breakfast.    . QUEtiapine (SEROQUEL) 25 MG tablet Take by mouth.    . vitamin B-12 (CYANOCOBALAMIN) 1000 MCG tablet Take 1 tablet (1,000 mcg total) by mouth daily. 90 tablet 3  . amLODipine (NORVASC) 2.5 MG tablet Take 2.5 mg by mouth daily.    . sildenafil (VIAGRA) 100 MG tablet Take 100 mg by mouth daily as needed for erectile dysfunction.     No current facility-administered medications on file prior to visit.    Allergies  Allergen Reactions  . Eggs Or Egg-Derived Products Nausea Only  . Penicillins Other (See Comments)    Unknown Has patient had a PCN reaction causing immediate rash, facial/tongue/throat swelling, SOB or lightheadedness  with hypotension: Unknown Has patient had a PCN reaction causing severe rash involving mucus membranes or skin necrosis: Unknown Has patient had a PCN reaction that required hospitalization: Unknown Has patient had a PCN reaction occurring within the last 10 years: Unknown If all of the above answers are "NO", then may proceed with Cephalosporin use.   Marland Kitchen Shellfish-Derived Products Hives       Observations/Objective: Today's Vitals   11/14/19 1336  PainSc: 0-No pain   There is no height or weight on file to calculate BMI.  Physical Exam  Constitutional: No distress.  Psychiatric: Mood normal.    CBC    Component Value Date/Time   WBC 4.0 11/11/2019 1054   RBC 4.75 11/11/2019 1054   HGB 11.2 (L) 11/11/2019 1054   HCT 37.3 (L) 11/11/2019 1054   PLT 87 (L) 11/11/2019 1054   MCV 78.5 (L) 11/11/2019 1054   MCH 23.6 (L) 11/11/2019 1054   MCHC 30.0 11/11/2019 1054   RDW 19.8 (H) 11/11/2019 1054   LYMPHSABS 0.9 11/11/2019 1054   MONOABS 0.3 11/11/2019 1054   EOSABS 0.1 11/11/2019 1054   BASOSABS 0.0 11/11/2019 1054    CMP     Component Value Date/Time   NA 144 11/11/2019 1054   K 3.9 11/11/2019 1054   CL 108 11/11/2019 1054    CO2 26 11/11/2019 1054   GLUCOSE 95 11/11/2019 1054   BUN 29 (H) 11/11/2019 1054   CREATININE 0.86 11/11/2019 1054   CALCIUM 8.9 11/11/2019 1054   PROT 7.6 11/11/2019 1054   ALBUMIN 4.4 11/11/2019 1054   AST 18 11/11/2019 1054   ALT 10 11/11/2019 1054   ALKPHOS 39 11/11/2019 1054   BILITOT 2.2 (H) 11/11/2019 1054   GFRNONAA >60 11/11/2019 1054   GFRAA >60 11/11/2019 1054     Assessment and Plan: 1. 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 Iron deficiency anemia, unspecified iron deficiency anemia type   3. Thrombocytopenia ([place])   4. Lupus ([place])   5. Lewy body dementia with behavioral disturbance ([place])   6. 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 SRSF2, is associated with a poor prognosis.  STA G2 is associated with a poor prognosis. Repeat bone marrow biopsy on 08/09/2019 did not show significant morphological evidence of dysplasia.  Blasts were not increased.  Cytogenetics from that from bone marrow biopsy was normal. Decision was made to defer hypomethylating agent treatments due to his multiple comorbidities. Labs reviewed and discussed with patient. Thrombocytopenia and anemia has been stable.  Anemia, used to have iron deficiency, I recommend patient to continue ferrous sulfate 325 mg twice daily. Anemia level has improved since last visit. #Lupus, follow-up with rheumatology.  Patient is on low-dose prednisone. #Lewy body dementia, recommend patient to continue follow-up with neurology. #Goals of care discussion, discussed with patient and wife.  Given patient's multiple comorbidities, new diagnosis of Lewy body dementia, I recommend not to start aggressive treatment at this point and continue observation.  Wife  and patient agree with the plan  Follow Up Instructions: 4 months   I discussed the assessment and treatment plan with the patient. The patient was provided an opportunity to ask questions and all were answered. The patient agreed with the plan and demonstrated an understanding  of the instructions.  The patient was advised to call back or seek an in-person evaluation if the symptoms worsen or if the condition fails to improve as anticipated.    Earlie Server, MD 11/14/2019 4:01 PM

## 2019-11-22 ENCOUNTER — Other Ambulatory Visit: Payer: Self-pay

## 2019-11-22 ENCOUNTER — Ambulatory Visit (HOSPITAL_COMMUNITY)
Admission: RE | Admit: 2019-11-22 | Discharge: 2019-11-22 | Disposition: A | Discharge status: Home / Self Care | Payer: Medicare HMO | Source: Ambulatory Visit | Attending: Neurology | Admitting: Neurology

## 2019-11-22 DIAGNOSIS — G3183 Dementia with Lewy bodies: Secondary | ICD-10-CM | POA: Diagnosis not present

## 2019-11-22 DIAGNOSIS — F0280 Dementia in other diseases classified elsewhere without behavioral disturbance: Secondary | ICD-10-CM | POA: Diagnosis present

## 2020-03-07 DEATH — deceased

## 2020-03-08 ENCOUNTER — Telehealth: Payer: Self-pay | Admitting: *Deleted

## 2020-03-08 NOTE — Telephone Encounter (Signed)
Pts wife Angelita Ingles called on 03/08/20 to cx pts 03/13/20 lab/MD appt. She stated that he passed away. All appts has been cx.Marland KitchenMarland Kitchen

## 2020-03-13 ENCOUNTER — Inpatient Hospital Stay: Payer: Medicare HMO | Admitting: Oncology

## 2020-03-13 ENCOUNTER — Inpatient Hospital Stay: Payer: Medicare HMO

## 2020-07-19 ENCOUNTER — Ambulatory Visit: Payer: Medicare HMO | Admitting: Urology
